# Patient Record
Sex: Female | Born: 1995 | Hispanic: Yes | Marital: Married | Smoking: Never smoker
Health system: Southern US, Community
[De-identification: ages and names within clinical notes are randomized; demographics above are authoritative.]

## PROBLEM LIST (undated history)

## (undated) DIAGNOSIS — R011 Cardiac murmur, unspecified: Secondary | ICD-10-CM

## (undated) DIAGNOSIS — D649 Anemia, unspecified: Secondary | ICD-10-CM

## (undated) HISTORY — DX: Anemia, unspecified: D64.9

## (undated) HISTORY — DX: Cardiac murmur, unspecified: R01.1

---

## 2013-07-20 ENCOUNTER — Ambulatory Visit: Payer: Self-pay | Admitting: Family Medicine

## 2013-07-20 VITALS — BP 106/80 | HR 62 | Temp 99.4°F | Resp 18 | Ht 60.0 in | Wt 116.6 lb

## 2013-07-20 DIAGNOSIS — Z0289 Encounter for other administrative examinations: Secondary | ICD-10-CM

## 2013-07-20 NOTE — Progress Notes (Signed)
[address]   [place], [place]  [address]   [phone]  Subjective:    Patient ID: Valerie Robles, female    DOB: Oct 18, 1996, 17 y.o.   MRN: [id number]  HPI This 17 y.o. female presents for evaluation for college CPE.  Attending [employer] in [place], [place]. Last physical 12/2012. Immunizations---has picture of them; had to turn in to [employer].  Flu vaccine this year no.   Eye exam in [place]; +contacts; last visit 2013. Dental exam unsure.  Regular menses; menarche age 2; monthly; bleeding 4-5 days.   Review of Systems  Constitutional: Negative.   HENT: Negative.   Eyes: Negative.   Respiratory: Negative.   Cardiovascular: Negative.   Gastrointestinal: Negative.   Endocrine: Negative.   Genitourinary: Negative.   Musculoskeletal: Negative.   Skin: Negative.   Allergic/Immunologic: Negative.   Neurological: Negative.   Hematological: Negative.   Psychiatric/Behavioral: Negative.    History reviewed. No pertinent past medical history. History reviewed. No pertinent past surgical history. No Known Allergies No current outpatient prescriptions on file prior to visit.   No current facility-administered medications on file prior to visit.   History   Social History  . Marital Status: Single    Spouse Name: N/A    Number of Children: N/A  . Years of Education: N/A   Occupational History  . Not on file.   Social History Main Topics  . Smoking status: Never Smoker   . Smokeless tobacco: Not on file  . Alcohol Use: No  . Drug Use: No  . Sexual Activity: Not on file   Other Topics Concern  . Not on file   Social History Narrative   Marital status: single; +dating. Born in [place]; moved to [place] age 6.      Children: one      Lives: on campus; usually lives with Dad in [place].  Mom in [place].      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      Tobacco: none      Alcohol: none   Drugs:  None      Exercise:  Soccer.  No other sports.   Family History  Problem Relation Age of Onset  . Asthma Sister        Objective:   Physical Exam  Nursing note and vitals reviewed. Constitutional: She is oriented to person, place, and time. She appears well-developed and well-nourished. No distress.  HENT:  Head: Normocephalic and atraumatic.  Right Ear: External ear normal.  Left Ear: External ear normal.  Nose: Nose normal.  Mouth/Throat: Oropharynx is clear and moist.  Eyes: Conjunctivae are normal. Pupils are equal, round, and reactive to light.  Neck: Normal range of motion. Neck supple. No thyromegaly present.  Cardiovascular: Normal rate, regular rhythm, normal heart sounds and intact distal pulses.  Exam reveals no gallop and no friction rub.   No murmur heard. Pulmonary/Chest: Effort normal and breath sounds normal. She has no wheezes. She has no rales.  Abdominal: Soft. Bowel sounds are normal. She exhibits no distension and no mass. There is no tenderness. There is no rebound and no guarding.  Musculoskeletal:       Right shoulder: Normal.       Left shoulder: Normal.       Cervical back: Normal.       Lumbar back: Normal.  Lymphadenopathy:    She has no cervical adenopathy.  Neurological: She is alert and oriented to person, place, and time. No cranial nerve deficit. She exhibits normal muscle tone. Coordination normal.  Skin: Skin is warm and dry. No rash noted. She is not diaphoretic.  Psychiatric: She has a normal mood and affect. Her behavior is normal. Judgment and thought content normal.       Assessment & Plan:  Health examination of defined subpopulation  1.  College physical: anticipatory guidance provided.  Recommend the following immunizations:  Meningococcal, Gardisil series, Hepatitis A series, flu vaccine.  Clearance for college; emotionally asymptomatic; no evidence of depression.  No chronic medical conditions that will interfere/prevent  participation in college courses.

## 2013-07-20 NOTE — Patient Instructions (Addendum)
1.  RECOMMEND THE FOLLOWING IMMUNIZATIONS:  MENINGOCOCCAL VACCINE   GARDISIL SERIES (FOR HUMAN PAPILLOMAVIRUS)      HEPATITIS A SERIES  FLU VACCINE EVERY FALL.

## 2015-04-19 ENCOUNTER — Ambulatory Visit (INDEPENDENT_AMBULATORY_CARE_PROVIDER_SITE_OTHER): Payer: No Typology Code available for payment source | Admitting: Family Medicine

## 2015-04-19 VITALS — BP 120/72 | HR 57 | Temp 98.6°F | Resp 16 | Ht 60.0 in | Wt 119.0 lb

## 2015-04-19 DIAGNOSIS — Z7185 Encounter for immunization safety counseling: Secondary | ICD-10-CM

## 2015-04-19 DIAGNOSIS — Z7189 Other specified counseling: Secondary | ICD-10-CM

## 2015-04-19 DIAGNOSIS — Z23 Encounter for immunization: Secondary | ICD-10-CM

## 2015-04-19 DIAGNOSIS — Z111 Encounter for screening for respiratory tuberculosis: Secondary | ICD-10-CM

## 2015-04-19 NOTE — Progress Notes (Signed)
Subjective:  This chart was scribed for [name], MD by [facility], medical scribe at [facility].The patient was seen in exam room 02 and the patient's care was started at 5:33 PM.   Patient ID: Valerie Robles, female    DOB: 1996/04/19, 19 y.o.   MRN: [id number] Chief Complaint  Patient presents with  . Immunizations    School   HPI HPI Comments: Valerie Robles is a 19 y.o. female who presents to [facility] for immunizations for school. She needs hepatitis B, tetanus and TB testing. She is unsure of last tetanus and hepatitis B immunizations.  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 She is originally from [place] and moved her when she was [age]. No recent illness. She works at [employer].   There are no active problems to display for this patient.  Past Medical History  Diagnosis Date  . Anemia   . Heart murmur    History reviewed. No pertinent past surgical history. No Known Allergies Prior to Admission medications   Not on File   History   Social History  . Marital Status: Single    Spouse Name: N/A  . Number of Children: N/A  . Years of Education: N/A   Occupational History  . Not on file.   Social History Main Topics  . Smoking status: Never Smoker   . Smokeless tobacco: Not on file  . Alcohol Use: No  . Drug Use: No  . Sexual Activity: Not on file   Other Topics Concern  . Not on file   Social History Narrative   Marital status: single; +dating. Born in [place]; moved to [place] age 42.      Children: one      Lives: on campus; usually lives with Dad in [place].  Mom in [place].      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      Tobacco: none      Alcohol: none      Drugs:  None      Exercise:  Soccer.  No other sports.   Review of Systems     Objective:  BP 120/72 mmHg  Pulse 57  Temp(Src)  98.6 F (37 C) (Oral)  Resp 16  Ht 5' (1.524 m)  Wt 119 lb (53.978 kg)  BMI 23.24 kg/m2  SpO2 98%  LMP 04/18/2015 Physical Exam  Constitutional: She is oriented to person, place, and time. She appears well-developed and well-nourished. No distress.  HENT:  Head: Normocephalic and atraumatic.  Eyes: Pupils are equal, round, and reactive to light.  Neck: Normal range of motion.  Cardiovascular: Normal rate, regular rhythm and normal heart sounds.  Exam reveals no gallop and no friction rub.   No murmur heard. Pulmonary/Chest: Effort normal and breath sounds normal. No respiratory distress.  Musculoskeletal: Normal range of motion.  Neurological: She is alert and oriented to person, place, and time.  Skin: Skin is warm and dry.  Psychiatric: She has a normal mood and affect. Her behavior is normal.  Nursing note and vitals reviewed.     Assessment & Plan:   Valerie Robles is a 19 y.o. female Immunization counseling - Plan: Hepatitis B surface antibody  - does not appear to have had all 3 hep B vaccines. Check titer, then possible booster.   -immunization registry appears to  have combine her info with other record. Advised to call health dept where most of her records were from and have them look into correcting this.   Need for Tdap vaccination - Plan: Tdap vaccine greater than or equal to 7yo IM given.   Screening-pulmonary TB - Plan: TB Skin Test placed.    No orders of the defined types were placed in this encounter.   Patient Instructions  Return in 48-72 hours for reading tb skin test.  You should receive a call or letter about your lab results within the next week to 10 days (hep B immunity to determine if you need further vaccinations).  TDAP given today.   Call the [facility] department - I can not see a database for you on the immunization registry, and it appears that your immunizations may have been merged with another person. They should be able  to help get this information corrected.   Tdap Vaccine (Tetanus, Diphtheria, Pertussis): What You Need to Know 1. Why get vaccinated? Tetanus, diphtheria and pertussis can be very serious diseases, even for adolescents and adults. Tdap vaccine can protect [place] from these diseases. TETANUS (Lockjaw) causes painful muscle tightening and stiffness, usually all over the body.  It can lead to tightening of muscles in the head and neck so you can't open your mouth, swallow, or sometimes even breathe. Tetanus kills about 1 out of 5 people who are infected. DIPHTHERIA can cause a thick coating to form in the back of the throat.  It can lead to breathing problems, paralysis, heart failure, and death. PERTUSSIS (Whooping Cough) causes severe coughing spells, which can cause difficulty breathing, vomiting and disturbed sleep.  It can also lead to weight loss, incontinence, and rib fractures. Up to 2 in 100 adolescents and 5 in 100 adults with pertussis are hospitalized or have complications, which could include pneumonia or death. These diseases are caused by bacteria. Diphtheria and pertussis are spread from person to person through coughing or sneezing. Tetanus enters the body through cuts, scratches, or wounds. Before vaccines, the [place] saw as many as 200,000 cases a year of diphtheria and pertussis, and hundreds of cases of tetanus. Since vaccination began, tetanus and diphtheria have dropped by about 99% and pertussis by about 80%. 2. Tdap vaccine Tdap vaccine can protect adolescents and adults from tetanus, diphtheria, and pertussis. One dose of Tdap is routinely given at age 41 or 72. People who did not get Tdap at that age should get it as soon as possible. Tdap is especially important for health care professionals and anyone having close contact with a baby younger than 12 months. Pregnant women should get a dose of Tdap during every pregnancy, to protect the newborn from pertussis. Infants  are most at risk for severe, life-threatening complications from pertussis. A similar vaccine, called Td, protects from tetanus and diphtheria, but not pertussis. A Td booster should be given every 10 years. Tdap may be given as one of these boosters if you have not already gotten a dose. Tdap may also be given after a severe cut or burn to prevent tetanus infection. Your doctor can give you more information. Tdap may safely be given at the same time as other vaccines. 3. Some people should not get this vaccine  If you ever had a life-threatening allergic reaction after a dose of any tetanus, diphtheria, or pertussis containing vaccine, OR if you have a severe allergy to any part of this vaccine, you should not  get Tdap. Tell your doctor if you have any severe allergies.  If you had a coma, or long or multiple seizures within 7 days after a childhood dose of DTP or DTaP, you should not get Tdap, unless a cause other than the vaccine was found. You can still get Td.  Talk to your doctor if you:  have epilepsy or another nervous system problem,  had severe pain or swelling after any vaccine containing diphtheria, tetanus or pertussis,  ever had Guillain-Barr Syndrome (GBS),  aren't feeling well on the day the shot is scheduled. 4. Risks of a vaccine reaction With any medicine, including vaccines, there is a chance of side effects. These are usually mild and go away on their own, but serious reactions are also possible. Brief fainting spells can follow a vaccination, leading to injuries from falling. Sitting or lying down for about 15 minutes can help prevent these. Tell your doctor if you feel dizzy or light-headed, or have vision changes or ringing in the ears. Mild problems following Tdap (Did not interfere with activities)  Pain where the shot was given (about 3 in 4 adolescents or 2 in 3 adults)  Redness or swelling where the shot was given (about 1 person in 5)  Mild fever of at  least 100.5F (up to about 1 in 25 adolescents or 1 in 100 adults)  Headache (about 3 or 4 people in 10)  Tiredness (about 1 person in 3 or 4)  Nausea, vomiting, diarrhea, stomach ache (up to 1 in 4 adolescents or 1 in 10 adults)  Chills, body aches, sore joints, rash, swollen glands (uncommon) Moderate problems following Tdap (Interfered with activities, but did not require medical attention)  Pain where the shot was given (about 1 in 5 adolescents or 1 in 100 adults)  Redness or swelling where the shot was given (up to about 1 in 16 adolescents or 1 in 25 adults)  Fever over 102F (about 1 in 100 adolescents or 1 in 250 adults)  Headache (about 3 in 20 adolescents or 1 in 10 adults)  Nausea, vomiting, diarrhea, stomach ache (up to 1 or 3 people in 100)  Swelling of the entire arm where the shot was given (up to about 3 in 100). Severe problems following Tdap (Unable to perform usual activities; required medical attention)  Swelling, severe pain, bleeding and redness in the arm where the shot was given (rare). A severe allergic reaction could occur after any vaccine (estimated less than 1 in a million doses). 5. What if there is a serious reaction? What should I look for?  Look for anything that concerns you, such as signs of a severe allergic reaction, very high fever, or behavior changes. Signs of a severe allergic reaction can include hives, swelling of the face and throat, difficulty breathing, a fast heartbeat, dizziness, and weakness. These would start a few minutes to a few hours after the vaccination. What should I do?  If you think it is a severe allergic reaction or other emergency that can't wait, call 9-1-1 or get the person to the nearest hospital. Otherwise, call your doctor.  Afterward, the reaction should be reported to the "Vaccine Adverse Event Reporting System" (VAERS). Your doctor might file this report, or you can do it yourself through the VAERS web site at  www.vaers.LAgents.no, or by calling 1-(505)514-6137. VAERS is only for reporting reactions. They do not give medical advice.  6. The National Vaccine Injury Compensation Program The [employer] Vaccine Injury Compensation  Program (VICP) is a Stage manager that was created to compensate people who may have been injured by certain vaccines. Persons who believe they may have been injured by a vaccine can learn about the program and about filing a claim by calling 1-845-574-4187 or visiting the VICP website at SpiritualWord.at. 7. How can I learn more?  Ask your doctor.  Call your local or state health department.  Contact the Centers for Disease Control and Prevention (CDC):  Call 458-758-8816 or visit CDC's website at PicCapture.uy. CDC Tdap Vaccine VIS (03/11/12) Document Released: 04/20/2012 Document Revised: 03/06/2014 Document Reviewed: 02/01/2014 ExitCare Patient Information 2015 [place], [place]. This information is not intended to replace advice given to you by your health care provider. Make sure you discuss any questions you have with your health care provider.      I personally performed the services described in this documentation, which was scribed in my presence. The recorded information has been reviewed and considered, and addended by me as needed.

## 2015-04-19 NOTE — Patient Instructions (Addendum)
Return in 48-72 hours for reading tb skin test.  You should receive a call or letter about your lab results within the next week to 10 days (hep B immunity to determine if you need further vaccinations).  TDAP given today.   Call the [facility] department - I can not see a database for you on the immunization registry, and it appears that your immunizations may have been merged with another person. They should be able to help get this information corrected.   Tdap Vaccine (Tetanus, Diphtheria, Pertussis): What You Need to Know 1. Why get vaccinated? Tetanus, diphtheria and pertussis can be very serious diseases, even for adolescents and adults. Tdap vaccine can protect [place] from these diseases. TETANUS (Lockjaw) causes painful muscle tightening and stiffness, usually all over the body.  It can lead to tightening of muscles in the head and neck so you can't open your mouth, swallow, or sometimes even breathe. Tetanus kills about 1 out of 5 people who are infected. DIPHTHERIA can cause a thick coating to form in the back of the throat.  It can lead to breathing problems, paralysis, heart failure, and death. PERTUSSIS (Whooping Cough) causes severe coughing spells, which can cause difficulty breathing, vomiting and disturbed sleep.  It can also lead to weight loss, incontinence, and rib fractures. Up to 2 in 100 adolescents and 5 in 100 adults with pertussis are hospitalized or have complications, which could include pneumonia or death. These diseases are caused by bacteria. Diphtheria and pertussis are spread from person to person through coughing or sneezing. Tetanus enters the body through cuts, scratches, or wounds. Before vaccines, the [place] saw as many as 200,000 cases a year of diphtheria and pertussis, and hundreds of cases of tetanus. Since vaccination began, tetanus and diphtheria have dropped by about 99% and pertussis by about 80%. 2. Tdap vaccine Tdap vaccine can  protect adolescents and adults from tetanus, diphtheria, and pertussis. One dose of Tdap is routinely given at age 8 or 91. People who did not get Tdap at that age should get it as soon as possible. Tdap is especially important for health care professionals and anyone having close contact with a baby younger than 12 months. Pregnant women should get a dose of Tdap during every pregnancy, to protect the newborn from pertussis. Infants are most at risk for severe, life-threatening complications from pertussis. A similar vaccine, called Td, protects from tetanus and diphtheria, but not pertussis. A Td booster should be given every 10 years. Tdap may be given as one of these boosters if you have not already gotten a dose. Tdap may also be given after a severe cut or burn to prevent tetanus infection. Your doctor can give you more information. Tdap may safely be given at the same time as other vaccines. 3. Some people should not get this vaccine  If you ever had a life-threatening allergic reaction after a dose of any tetanus, diphtheria, or pertussis containing vaccine, OR if you have a severe allergy to any part of this vaccine, you should not get Tdap. Tell your doctor if you have any severe allergies.  If you had a coma, or long or multiple seizures within 7 days after a childhood dose of DTP or DTaP, you should not get Tdap, unless a cause other than the vaccine was found. You can still get Td.  Talk to your doctor if you:  have epilepsy or another nervous system problem,  had severe pain or swelling after  any vaccine containing diphtheria, tetanus or pertussis,  ever had Guillain-Barr Syndrome (GBS),  aren't feeling well on the day the shot is scheduled. 4. Risks of a vaccine reaction With any medicine, including vaccines, there is a chance of side effects. These are usually mild and go away on their own, but serious reactions are also possible. Brief fainting spells can follow a  vaccination, leading to injuries from falling. Sitting or lying down for about 15 minutes can help prevent these. Tell your doctor if you feel dizzy or light-headed, or have vision changes or ringing in the ears. Mild problems following Tdap (Did not interfere with activities)  Pain where the shot was given (about 3 in 4 adolescents or 2 in 3 adults)  Redness or swelling where the shot was given (about 1 person in 5)  Mild fever of at least 100.36F (up to about 1 in 25 adolescents or 1 in 100 adults)  Headache (about 3 or 4 people in 10)  Tiredness (about 1 person in 3 or 4)  Nausea, vomiting, diarrhea, stomach ache (up to 1 in 4 adolescents or 1 in 10 adults)  Chills, body aches, sore joints, rash, swollen glands (uncommon) Moderate problems following Tdap (Interfered with activities, but did not require medical attention)  Pain where the shot was given (about 1 in 5 adolescents or 1 in 100 adults)  Redness or swelling where the shot was given (up to about 1 in 16 adolescents or 1 in 25 adults)  Fever over 102F (about 1 in 100 adolescents or 1 in 250 adults)  Headache (about 3 in 20 adolescents or 1 in 10 adults)  Nausea, vomiting, diarrhea, stomach ache (up to 1 or 3 people in 100)  Swelling of the entire arm where the shot was given (up to about 3 in 100). Severe problems following Tdap (Unable to perform usual activities; required medical attention)  Swelling, severe pain, bleeding and redness in the arm where the shot was given (rare). A severe allergic reaction could occur after any vaccine (estimated less than 1 in a million doses). 5. What if there is a serious reaction? What should I look for?  Look for anything that concerns you, such as signs of a severe allergic reaction, very high fever, or behavior changes. Signs of a severe allergic reaction can include hives, swelling of the face and throat, difficulty breathing, a fast heartbeat, dizziness, and weakness.  These would start a few minutes to a few hours after the vaccination. What should I do?  If you think it is a severe allergic reaction or other emergency that can't wait, call 9-1-1 or get the person to the nearest hospital. Otherwise, call your doctor.  Afterward, the reaction should be reported to the "Vaccine Adverse Event Reporting System" (VAERS). Your doctor might file this report, or you can do it yourself through the VAERS web site at www.vaers.LAgents.no, or by calling 1-(782)713-1166. VAERS is only for reporting reactions. They do not give medical advice.  6. The National Vaccine Injury Compensation Program The [employer] Vaccine Injury Compensation Program (VICP) is a federal program that was created to compensate people who may have been injured by certain vaccines. Persons who believe they may have been injured by a vaccine can learn about the program and about filing a claim by calling 1-270-763-4455 or visiting the VICP website at SpiritualWord.at. 7. How can I learn more?  Ask your doctor.  Call your local or state health department.  Contact the Centers for  Disease Control and Prevention (CDC):  Call (416)064-1867 or visit CDC's website at PicCapture.uy. CDC Tdap Vaccine VIS (03/11/12) Document Released: 04/20/2012 Document Revised: 03/06/2014 Document Reviewed: 02/01/2014 ExitCare Patient Information 2015 [place], [place]. This information is not intended to replace advice given to you by your health care provider. Make sure you discuss any questions you have with your health care provider.

## 2015-04-20 LAB — HEPATITIS B SURFACE ANTIBODY, QUANTITATIVE: Hepatitis B-Post: 346 m[IU]/mL

## 2015-04-22 ENCOUNTER — Ambulatory Visit (INDEPENDENT_AMBULATORY_CARE_PROVIDER_SITE_OTHER): Payer: No Typology Code available for payment source

## 2015-04-22 DIAGNOSIS — Z111 Encounter for screening for respiratory tuberculosis: Secondary | ICD-10-CM

## 2015-04-22 LAB — TB SKIN TEST: TB Skin Test: NEGATIVE

## 2018-11-04 ENCOUNTER — Ambulatory Visit: Payer: No Typology Code available for payment source | Admitting: Family Medicine

## 2019-05-23 ENCOUNTER — Emergency Department (HOSPITAL_COMMUNITY)
Admission: EM | Admit: 2019-05-23 | Discharge: 2019-05-23 | Disposition: A | Discharge status: Home / Self Care | Payer: 59 | Attending: Emergency Medicine | Admitting: Emergency Medicine

## 2019-05-23 ENCOUNTER — Encounter (HOSPITAL_COMMUNITY): Payer: Self-pay | Admitting: Emergency Medicine

## 2019-05-23 ENCOUNTER — Other Ambulatory Visit: Payer: Self-pay

## 2019-05-23 DIAGNOSIS — R519 Headache, unspecified: Secondary | ICD-10-CM

## 2019-05-23 DIAGNOSIS — Y9389 Activity, other specified: Secondary | ICD-10-CM | POA: Diagnosis not present

## 2019-05-23 DIAGNOSIS — Y929 Unspecified place or not applicable: Secondary | ICD-10-CM | POA: Diagnosis not present

## 2019-05-23 DIAGNOSIS — Y999 Unspecified external cause status: Secondary | ICD-10-CM | POA: Insufficient documentation

## 2019-05-23 DIAGNOSIS — W182XXA Fall in (into) shower or empty bathtub, initial encounter: Secondary | ICD-10-CM | POA: Insufficient documentation

## 2019-05-23 DIAGNOSIS — Y939 Activity, unspecified: Secondary | ICD-10-CM | POA: Diagnosis not present

## 2019-05-23 DIAGNOSIS — S0990XA Unspecified injury of head, initial encounter: Secondary | ICD-10-CM | POA: Diagnosis not present

## 2019-05-23 DIAGNOSIS — R51 Headache: Secondary | ICD-10-CM | POA: Diagnosis present

## 2019-05-23 LAB — POC URINE PREG, ED: Preg Test, Ur: NEGATIVE

## 2019-05-23 MED ORDER — METOCLOPRAMIDE HCL 10 MG PO TABS
10.0000 mg | ORAL_TABLET | Freq: Once | ORAL | Status: AC
  Administered 2019-05-23: 10:00:00 10 mg via ORAL
  Filled 2019-05-23: qty 1

## 2019-05-23 MED ORDER — ACETAMINOPHEN 500 MG PO TABS
1000.0000 mg | ORAL_TABLET | Freq: Once | ORAL | Status: AC
  Administered 2019-05-23: 11:00:00 1000 mg via ORAL
  Filled 2019-05-23: qty 2

## 2019-05-23 MED ORDER — METOCLOPRAMIDE HCL 10 MG PO TABS: 10.0000 mg | ORAL_TABLET | Freq: Four times a day (QID) | ORAL | 0 refills | Status: AC | PRN

## 2019-05-23 MED ORDER — DIPHENHYDRAMINE HCL 25 MG PO CAPS
25.0000 mg | ORAL_CAPSULE | Freq: Once | ORAL | Status: AC
  Administered 2019-05-23: 10:00:00 25 mg via ORAL
  Filled 2019-05-23: qty 1

## 2019-05-23 NOTE — ED Notes (Signed)
Patient verbalizes understanding of discharge instructions. Opportunity for questioning and answers were provided. Armband removed by staff, pt discharged from ED.  

## 2019-05-23 NOTE — Discharge Instructions (Addendum)
Please see the information and instructions below regarding your visit.  Your diagnoses today include:  1. Bad headache   2. Injury of head, initial encounter    Concussions are caused by acceleration/deceleration forces of the brain against the skull and in mild forms, cannot be seen on any imaging. The injury occurs at the microscopic level, and causes a disturbance more in function than the structure of the brain itself. Common symptoms of concussion include: ?Poor coordination such as stumbling or inability to walk in a straight line ?Vacant stare (befuddled facial expression) ?Delayed verbal expression (slower to answer questions or follow instructions) ?Inability to focus attention (easily distracted and unable to follow through with normal activities) ?Disorientation (walking in the wrong direction, unaware of time, date, place) ?Slurred or incoherent speech (making disjointed or incomprehensible statements) ?Emotionality out of proportion to circumstances (appearing distraught, crying for no apparent reason) ?Memory deficits (exhibited by patient repeatedly asking the same question that has already been answered or inability to recall three of three words after five minutes)  Tests performed today include:  See side panel of your discharge paperwork for testing performed today. Vital signs are listed at the bottom of these instructions.   Pregnancy test negative.   Medications prescribed:    Take only ibuprofen (Advil) or naproxen (Aleve), and acetaminophen (Tylenol).  You may take Reglan every 6 hours as needed for nausea.  Combined with Benadryl 25 mg.  Take any prescribed medications only as prescribed, and any over the counter medications only as directed on the packaging.  Home care instructions:  Please follow any educational materials contained in this packet.   (Elevate mattress if pillow is ineffective)  Do not take tranquilizers, sedatives, narcotics or alcohol  Use ice packs for comfort   Over the next couple days, avoid screen time is much as possible.  If screen time or other activity does not cause you a headache, you may continue to this activity until you do develop symptoms, then discontinue the activity.  Follow-up instructions: Please follow-up with Dr. Tamala Julian of Sports Medicine for further evaluation of your symptoms.   Return instructions:  Please return to the Emergency Department if you experience worsening symptoms.   If any of the following occur notify your physician or go to the Hospital Emergency Department:  Increased drowsiness, confusion, or loss of consciousness  Restlessness or convulsions (fits)  Paralysis in arms or legs  Temperature above 100 F  Vomiting  Severe headache  Blood or clear fluid dripping from the nose or ears  Stiffness of the neck  Dizziness or blurred vision  Pulsating pain in the eye  Unequal pupils of eye  Personality changes  Any other unusual symptoms  Please return if you have any other emergent concerns.  Additional Information:   Your vital signs today were: BP 135/85 (BP Location: Right Arm)    Pulse (!) 54    Temp 98.7 F (37.1 C) (Oral)    Resp 16    Ht 5' (1.524 m)    Wt 66.2 kg    SpO2 99%    BMI 28.51 kg/m  If your blood pressure (BP) was elevated on multiple readings during this visit above 130 for the top number or above 80 for the bottom number, please have this repeated by your primary care provider within one month. --------------  Thank you for allowing [place] to participate in your care today. It was my pleasure to care for you!

## 2019-05-23 NOTE — ED Provider Notes (Signed)
[facility] Provider Note   CSN: [id number] Arrival date & time: 05/23/19  [id number]     History   Chief Complaint Chief Complaint  Patient presents with  . Headache    HPI Valerie BailiffKarla Berenice Robles is a 23 y.o. female.     HPI  Patient is a 23 year old female past medical history of anemia and heart murmur presenting for daily headaches.  Patient reports that she was playing hide and seek with children approximately 10 days ago when she fell backwards into the bathtub and struck the back of her head.  She denies loss of consciousness, confusion, speech deficit, or vomiting at the time of the incident.  Subsequently, patient reports daily headaches that are throbbing in nature.  She is occasionally photophobic.  She reports that resting in a dark room makes them better.  No positional difference in the pain.  She denies any visual disturbance, numbness or tingling extremities, speech disturbance, or discoordinated gait.  Patient reports that 6 days ago she vomited 1 time and she occasionally has nausea.  She denies any congestion, neck stiffness, sore throat, cough.  No recent coronavirus exposures.  She has been taking intermittent Tylenol and ibuprofen for symptoms.  Patient reports that she has a history of headaches, and multiple concussions in her lifetime.  She has never been evaluated by neurology.  Past Medical History:  Diagnosis Date  . Anemia   . Heart murmur     There are no active problems to display for this patient.   History reviewed. No pertinent surgical history.   OB History   No obstetric history on file.      Home Medications    Prior to Admission medications   Not on File    Family History Family History  Problem Relation Age of Onset  . Asthma Sister     Social History Social History   Tobacco Use  . Smoking status: Never Smoker  Substance Use Topics  . Alcohol use: No  . Drug use: No     Allergies    Patient has no known allergies.   Review of Systems Review of Systems  Constitutional: Negative for chills and fever.  HENT: Positive for rhinorrhea. Negative for congestion.   Respiratory: Negative for shortness of breath.   Gastrointestinal: Positive for nausea. Negative for vomiting.  Musculoskeletal: Negative for neck pain and neck stiffness.  Skin: Negative for rash.  Neurological: Positive for headaches.     Physical Exam Updated Vital Signs BP 135/85 (BP Location: Right Arm)   Pulse (!) 54   Temp 98.7 F (37.1 C) (Oral)   Resp 16   Ht 5' (1.524 m)   Wt 66.2 kg   SpO2 99%   BMI 28.51 kg/m   Physical Exam Vitals signs and nursing note reviewed.  Constitutional:      General: She is not in acute distress.    Appearance: She is well-developed. She is not diaphoretic.     Comments: Sitting comfortably in bed.  HENT:     Head: Normocephalic and atraumatic.  Eyes:     General:        Right eye: No discharge.        Left eye: No discharge.     Conjunctiva/sclera: Conjunctivae normal.     Comments: EOMs normal to gross examination.  Neck:     Musculoskeletal: Normal range of motion. No neck rigidity.  Cardiovascular:     Rate and Rhythm: Normal rate  and regular rhythm.     Comments: Intact, 2+ radial pulse. Pulmonary:     Comments: Converses comfortably.  No audible wheeze or stridor. Abdominal:     General: There is no distension.  Musculoskeletal: Normal range of motion.  Skin:    General: Skin is warm and dry.  Neurological:     Mental Status: She is alert and oriented to person, place, and time. Mental status is at baseline.     GCS: GCS eye subscore is 4. GCS verbal subscore is 5. GCS motor subscore is 6.     Comments: Mental Status:  Alert, oriented, thought content appropriate, able to give a coherent history. Speech fluent without evidence of aphasia. Able to follow 2 step commands without difficulty.  Cranial Nerves:  II:  Peripheral visual fields  grossly normal, pupils equal, round, reactive to light III,IV, VI: ptosis not present, extra-ocular motions intact bilaterally  V,VII: smile symmetric, facial light touch sensation equal VIII: hearing grossly normal to voice  X: uvula elevates symmetrically  XI: bilateral shoulder shrug symmetric and strong XII: midline tongue extension without fassiculations Motor:  Normal tone. 5/5 in upper and lower extremities bilaterally including strong and equal grip strength and dorsiflexion/plantar flexion Sensory: Light touch normal in all extremities.  Cerebellar: normal finger-to-nose with bilateral upper extremities Gait: normal gait and balance. Performs tandem walking without difficulty Stance:  No pronator drift and good coordination, strength, and position sense with tapping of bilateral arms (performed in sitting position). CV: distal pulses palpable throughout    Psychiatric:        Behavior: Behavior normal.        Thought Content: Thought content normal.        Judgment: Judgment normal.      ED Treatments / Results  Labs (all labs ordered are listed, but only abnormal results are displayed) Labs Reviewed  POC URINE PREG, ED    EKG None  Radiology No results found.  Procedures Procedures (including critical care time)  Medications Ordered in ED Medications  metoCLOPramide (REGLAN) tablet 10 mg (10 mg Oral Given 05/23/19 1017)  diphenhydrAMINE (BENADRYL) capsule 25 mg (25 mg Oral Given 05/23/19 1017)     Initial Impression / Assessment and Plan / ED Course  I have reviewed the triage vital signs and the nursing notes.  Pertinent labs & imaging results that were available during my care of the patient were reviewed by me and considered in my medical decision making (see chart for details).        This is a well-appearing 23 year old female with past medical history of headaches presenting for daily headache for 10 days after head injury.  She had one episode of  vomiting within the last week several days after the injury but otherwise has not been vomiting, denies any focal neurologic deficits.  [facility] She reports that she has a history of concussions going back to her high school years.  No red flag signs or symptoms on exam today.  No ongoing vomiting.  Pregnancy test is negative today.  Suspect that patient does have ongoing concussion.  Will give Reglan, Benadryl for symptomatic relief with over-the-counter medications and referred to concussion clinic.  Patient is given return precautions for any worsening headache, ongoing vomiting, visual disturbance weakness of extremities, speech disturbance, or any focal neurologic symptoms.  Patient is in understanding and agrees with the plan of care.  Final Clinical Impressions(s) / ED Diagnoses   Final  diagnoses:  Bad headache  Injury of head, initial encounter    ED Discharge Orders         Ordered    metoCLOPramide (REGLAN) 10 MG tablet  Every 6 hours PRN     05/23/19 1046           Albesa Seen, PA-C 05/23/19 1049    Julianne Rice, MD 05/24/19 [phone]

## 2019-05-23 NOTE — ED Triage Notes (Addendum)
Pt arrives to ED from home with complaints of headache x2 weeks after a mechanical fall. Pt states she has no vision issues but got new contacts a month ago. Pt states she has been nauseas, had one episode of emesis, and has loss of appetite. 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

## 2019-05-24 ENCOUNTER — Telehealth: Payer: Self-pay

## 2019-05-24 NOTE — Telephone Encounter (Signed)
-----   Message from Lyndal Pulley, DO sent at 05/24/2019  7:32 AM EDT ----- Regarding: FW: ED Patient Follow Up What do you think? ----- Message ----- From: Tamala Julian Sent: 05/23/2019   6:16 PM EDT To: Lyndal Pulley, DO Subject: ED Patient Follow Up                           Hi Dr. Tamala Julian,  I evaluated the attached patient today after a head injury 10 days prior and ongoing headaches. She has a history of concussion and I think is sensitive to developing them. I was wondering if she could be seen in the concussion clinic? Thank you for caring for mutual patients.   Best,  Alyssa B. Valere Dross, PA-C

## 2019-05-24 NOTE — Telephone Encounter (Signed)
Left message for patient to call back to schedule in Concussion Clinic.  

## 2019-05-26 ENCOUNTER — Encounter (HOSPITAL_COMMUNITY): Payer: Self-pay

## 2019-05-26 ENCOUNTER — Other Ambulatory Visit: Payer: Self-pay

## 2019-05-26 ENCOUNTER — Emergency Department (HOSPITAL_COMMUNITY)
Admission: EM | Admit: 2019-05-26 | Discharge: 2019-05-26 | Disposition: A | Discharge status: Home / Self Care | Payer: 59 | Attending: Emergency Medicine | Admitting: Emergency Medicine

## 2019-05-26 ENCOUNTER — Emergency Department (HOSPITAL_COMMUNITY): Payer: 59

## 2019-05-26 DIAGNOSIS — R110 Nausea: Secondary | ICD-10-CM | POA: Diagnosis not present

## 2019-05-26 DIAGNOSIS — R42 Dizziness and giddiness: Secondary | ICD-10-CM | POA: Insufficient documentation

## 2019-05-26 DIAGNOSIS — F0781 Postconcussional syndrome: Secondary | ICD-10-CM | POA: Diagnosis not present

## 2019-05-26 DIAGNOSIS — R51 Headache: Secondary | ICD-10-CM | POA: Diagnosis not present

## 2019-05-26 LAB — CBC WITH DIFFERENTIAL/PLATELET
Abs Immature Granulocytes: 0.01 10*3/uL (ref 0.00–0.07)
Basophils Absolute: 0 10*3/uL (ref 0.0–0.1)
Basophils Relative: 0 %
Eosinophils Absolute: 0 10*3/uL (ref 0.0–0.5)
Eosinophils Relative: 0 %
HCT: 38.7 % (ref 36.0–46.0)
Hemoglobin: 12.5 g/dL (ref 12.0–15.0)
Immature Granulocytes: 0 %
Lymphocytes Relative: 32 %
Lymphs Abs: 2.3 10*3/uL (ref 0.7–4.0)
MCH: 28.2 pg (ref 26.0–34.0)
MCHC: 32.3 g/dL (ref 30.0–36.0)
MCV: 87.2 fL (ref 80.0–100.0)
Monocytes Absolute: 0.5 10*3/uL (ref 0.1–1.0)
Monocytes Relative: 6 %
Neutro Abs: 4.5 10*3/uL (ref 1.7–7.7)
Neutrophils Relative %: 62 %
Platelets: 377 10*3/uL (ref 150–400)
RBC: 4.44 MIL/uL (ref 3.87–5.11)
RDW: 12.7 % (ref 11.5–15.5)
WBC: 7.4 10*3/uL (ref 4.0–10.5)
nRBC: 0 % (ref 0.0–0.2)

## 2019-05-26 LAB — BASIC METABOLIC PANEL
Anion gap: 10 (ref 5–15)
BUN: 7 mg/dL (ref 6–20)
CO2: 22 mmol/L (ref 22–32)
Calcium: 9.7 mg/dL (ref 8.9–10.3)
Chloride: 104 mmol/L (ref 98–111)
Creatinine, Ser: 0.64 mg/dL (ref 0.44–1.00)
GFR calc Af Amer: >60 mL/min (ref >60)
GFR calc non Af Amer: >60 mL/min (ref >60)
Glucose, Bld: 90 mg/dL (ref 70–99)
Potassium: 3.9 mmol/L (ref 3.5–5.1)
Sodium: 136 mmol/L (ref 135–145)

## 2019-05-26 MED ORDER — DIPHENHYDRAMINE HCL 25 MG PO CAPS
25.0000 mg | ORAL_CAPSULE | Freq: Once | ORAL | Status: AC
  Administered 2019-05-26: 25 mg via ORAL
  Filled 2019-05-26: qty 1

## 2019-05-26 MED ORDER — METOCLOPRAMIDE HCL 10 MG PO TABS
10.0000 mg | ORAL_TABLET | Freq: Once | ORAL | Status: AC
  Administered 2019-05-26: 12:00:00 10 mg via ORAL
  Filled 2019-05-26: qty 1

## 2019-05-26 MED ORDER — KETOROLAC TROMETHAMINE 30 MG/ML IJ SOLN
30.0000 mg | Freq: Once | INTRAMUSCULAR | Status: AC
  Administered 2019-05-26: 12:00:00 30 mg via INTRAMUSCULAR
  Filled 2019-05-26: qty 1

## 2019-05-26 NOTE — ED Notes (Signed)
ED Provider at bedside. 

## 2019-05-26 NOTE — ED Triage Notes (Signed)
Pt presents to ED with dizziness and headache.  Pt fell on 05/15/19 and struck her head on 05/23/19 pt was diagnosed with a concussion.  Today pt returned to work for the first time and became very dizzy upon movement and also c/o 10/10 headache.  Pt also c/o nausea but no vomiting.  Pt reports no head scans were done on Monday.

## 2019-05-26 NOTE — ED Notes (Signed)
Pt returned from CT at this time.  

## 2019-05-26 NOTE — ED Notes (Signed)
Patient transported to CT 

## 2019-05-26 NOTE — ED Notes (Signed)
Pt provided with ice pack for head

## 2019-05-26 NOTE — ED Provider Notes (Signed)
[facility] EMERGENCY DEPARTMENT Provider Note   CSN: [id number] Arrival date & time: 05/26/19  1112    History   Chief Complaint Chief Complaint  Patient presents with  . Near Syncope    HPI Valerie Robles is a 23 y.o. female.     HPI   23 year old female presents today with complaints of headache.  Patient notes approximately 2 weeks ago she fell and hit the posterior aspect of her head.  She notes this was a mechanical fall.  Since that time she has had a posterior throbbing headache.  She notes light sensitivity, she denies any neurological deficits.  Patient notes that she gets dizzy especially when she moves, no dizziness at rest.  She denies any vomiting but notes some nausea.  She notes photophobia and fatigue.  She attempted to go back to work today but noticed that the lights were bothering her eyes too much.  She was given Reglan and ibuprofen that originally was helping, this is no longer helping her headaches.  She denies any chest pain or shortness of breath, she denies any abnormal bleeding she does note a history of anemia but has not had this checked recently.   Past Medical History:  Diagnosis Date  . Anemia   . Heart murmur     There are no active problems to display for this patient.   History reviewed. No pertinent surgical history.   OB History   No obstetric history on file.      Home Medications    Prior to Admission medications   Medication Sig Start Date End Date Taking? Authorizing Provider  metoCLOPramide (REGLAN) 10 MG tablet Take 1 tablet (10 mg total) by mouth every 6 (six) hours as needed for up to 5 days for nausea. 05/23/19 05/28/19  Albesa Seen, PA-C    Family History Family History  Problem Relation Age of Onset  . Asthma Sister     Social History Social History   Tobacco Use  . Smoking status: Never Smoker  . Smokeless tobacco: Never Used  Substance Use Topics  . Alcohol use: No  . Drug  use: No     Allergies   Patient has no known allergies.   Review of Systems Review of Systems  All other systems reviewed and are negative.    Physical Exam Updated Vital Signs BP 121/79 (BP Location: Right Arm)   Pulse (!) 49   Temp 98.2 F (36.8 C) (Oral)   Resp 14   Ht 5' (1.524 m)   Wt 66.2 kg   LMP 05/19/2019   SpO2 98%   BMI 28.51 kg/m   Physical Exam Vitals signs and nursing note reviewed.  Constitutional:      Appearance: She is well-developed.  HENT:     Head: Normocephalic and atraumatic.     Comments: Head is atraumatic no deformity of the skull, no signs of basal skull fracture neck nontender to palpation full active range of motion Eyes:     General: No scleral icterus.       Right eye: No discharge.        Left eye: No discharge.     Conjunctiva/sclera: Conjunctivae normal.     Pupils: Pupils are equal, round, and reactive to light.  Neck:     Musculoskeletal: Normal range of motion.     Vascular: No JVD.     Trachea: No tracheal deviation.  Cardiovascular:     Rate and Rhythm: Normal rate  and regular rhythm.     Heart sounds: Murmur present.     Comments: Soft systolic murmur noted Pulmonary:     Effort: Pulmonary effort is normal.     Breath sounds: No stridor.  Neurological:     Mental Status: She is alert and oriented to person, place, and time.     GCS: GCS eye subscore is 4. GCS verbal subscore is 5. GCS motor subscore is 6.     Cranial Nerves: Cranial nerves are intact.     Sensory: Sensation is intact.     Motor: Motor function is intact.     Coordination: Coordination is intact. Coordination normal.  Psychiatric:        Behavior: Behavior normal.        Thought Content: Thought content normal.        Judgment: Judgment normal.      ED Treatments / Results  Labs (all labs ordered are listed, but only abnormal results are displayed) Labs Reviewed  CBC WITH DIFFERENTIAL/PLATELET  BASIC METABOLIC PANEL    EKG None   Radiology Ct Head Wo Contrast  Result Date: 05/26/2019 CLINICAL DATA:  Head trauma, headache. Additional history provided: Fall on Monday hitting back of head, now with headaches. EXAM: CT HEAD WITHOUT CONTRAST TECHNIQUE: Contiguous axial images were obtained from the base of the skull through the vertex without intravenous contrast. COMPARISON:  No prior studies available for comparison FINDINGS: Brain: There is no acute intracranial hemorrhage or demarcated territorial infarction. No evidence of intracranial mass. No midline shift or extra-axial collection. Vascular: No hyperdense vessel. Skull: No calvarial fracture. Sinuses/Orbits: The imaged globes are unremarkable. Partial opacification of the inferior frontal sinus and anterior ethmoid air cells on the left. There is otherwise mild scattered mucosal thickening within the paranasal sinuses. No significant mastoid effusion. IMPRESSION: No evidence of acute intracranial abnormality. Paranasal sinus disease as described. Electronically Signed   By: Jackey LogeKyle  Golden   On: 05/26/2019 14:35    Procedures Procedures (including critical care time)  Medications Ordered in ED Medications  ketorolac (TORADOL) 30 MG/ML injection 30 mg (30 mg Intramuscular Given 05/26/19 1229)  metoCLOPramide (REGLAN) tablet 10 mg (10 mg Oral Given 05/26/19 1229)  diphenhydrAMINE (BENADRYL) capsule 25 mg (25 mg Oral Given 05/26/19 1228)     Initial Impression / Assessment and Plan / ED Course  I have reviewed the triage vital signs and the nursing notes.  Pertinent labs & imaging results that were available during my care of the patient were reviewed by me and considered in my medical decision making (see chart for details).       23 year old female presents today with likely concussion.  Her head CT is reassuring with no acute cranial abnormality.  She is neurologically intact.  Her labs are reassuring.  Patient stable for outpatient follow-up with neurology, strict  return precautions given.  She verbalized understanding and agreement to today's plan had no further questions or concerns at the time of discharge.   Final Clinical Impressions(s) / ED Diagnoses   Final diagnoses:  Post concussion syndrome    ED Discharge Orders    None       Eyvonne MechanicHedges, Dyanara Cozza, PA-C 05/26/19 1502    Derwood KaplanNanavati, Ankit, MD 05/29/19 1429

## 2019-05-26 NOTE — Discharge Instructions (Signed)
Please read attached information. If you experience any new or worsening signs or symptoms please return to the emergency room for evaluation. Please follow-up with your primary care provider or specialist as discussed.  °

## 2019-05-30 ENCOUNTER — Telehealth: Payer: Self-pay

## 2019-05-30 NOTE — Telephone Encounter (Signed)
-----   Message from Alphonsus Sias, [place] sent at 05/30/2019 10:47 AM EDT ----- Regarding: concussion clinic appt

## 2019-05-30 NOTE — Telephone Encounter (Signed)
Called and left message for patient to call back.

## 2019-05-31 ENCOUNTER — Telehealth: Payer: Self-pay

## 2019-05-31 NOTE — Telephone Encounter (Signed)
Called patient to schedule for virtual concussion visit.  Patient sustained concussion on 7/20 after hitting the back of her head. She states that the next day she had a headache. No loss of consciousness. ED 7/20 and 7/23. CT was negative. Had some blood work done. Treatment plan consisted of decreased screen time. Previous concussion in earlier part of 2020. Patient states she "gets concussions easily". 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 Sleeping more due to headaches. Works as a Art therapist.

## 2019-06-04 ENCOUNTER — Other Ambulatory Visit: Payer: Self-pay

## 2019-06-04 ENCOUNTER — Encounter (HOSPITAL_COMMUNITY): Payer: Self-pay | Admitting: Emergency Medicine

## 2019-06-04 ENCOUNTER — Emergency Department (HOSPITAL_COMMUNITY)
Admission: EM | Admit: 2019-06-04 | Discharge: 2019-06-04 | Disposition: A | Discharge status: Home / Self Care | Payer: 59 | Attending: Emergency Medicine | Admitting: Emergency Medicine

## 2019-06-04 ENCOUNTER — Emergency Department (HOSPITAL_COMMUNITY): Payer: 59

## 2019-06-04 DIAGNOSIS — F0781 Postconcussional syndrome: Secondary | ICD-10-CM | POA: Diagnosis not present

## 2019-06-04 DIAGNOSIS — R110 Nausea: Secondary | ICD-10-CM | POA: Diagnosis not present

## 2019-06-04 DIAGNOSIS — R519 Headache, unspecified: Secondary | ICD-10-CM

## 2019-06-04 DIAGNOSIS — R51 Headache: Secondary | ICD-10-CM | POA: Insufficient documentation

## 2019-06-04 LAB — CBC WITH DIFFERENTIAL/PLATELET
Abs Immature Granulocytes: 0.01 10*3/uL (ref 0.00–0.07)
Basophils Absolute: 0 10*3/uL (ref 0.0–0.1)
Basophils Relative: 0 %
Eosinophils Absolute: 0.2 10*3/uL (ref 0.0–0.5)
Eosinophils Relative: 2 %
HCT: 38.3 % (ref 36.0–46.0)
Hemoglobin: 12.4 g/dL (ref 12.0–15.0)
Immature Granulocytes: 0 %
Lymphocytes Relative: 37 %
Lymphs Abs: 2.9 10*3/uL (ref 0.7–4.0)
MCH: 28.5 pg (ref 26.0–34.0)
MCHC: 32.4 g/dL (ref 30.0–36.0)
MCV: 88 fL (ref 80.0–100.0)
Monocytes Absolute: 0.6 10*3/uL (ref 0.1–1.0)
Monocytes Relative: 8 %
Neutro Abs: 4.1 10*3/uL (ref 1.7–7.7)
Neutrophils Relative %: 53 %
Platelets: 350 10*3/uL (ref 150–400)
RBC: 4.35 MIL/uL (ref 3.87–5.11)
RDW: 12.7 % (ref 11.5–15.5)
WBC: 7.7 10*3/uL (ref 4.0–10.5)
nRBC: 0 % (ref 0.0–0.2)

## 2019-06-04 LAB — BASIC METABOLIC PANEL
Anion gap: 11 (ref 5–15)
BUN: 9 mg/dL (ref 6–20)
CO2: 19 mmol/L — ABNORMAL LOW (ref 22–32)
Calcium: 9.5 mg/dL (ref 8.9–10.3)
Chloride: 104 mmol/L (ref 98–111)
Creatinine, Ser: 0.55 mg/dL (ref 0.44–1.00)
GFR calc Af Amer: >60 mL/min (ref >60)
GFR calc non Af Amer: >60 mL/min (ref >60)
Glucose, Bld: 100 mg/dL — ABNORMAL HIGH (ref 70–99)
Potassium: 3.9 mmol/L (ref 3.5–5.1)
Sodium: 134 mmol/L — ABNORMAL LOW (ref 135–145)

## 2019-06-04 LAB — I-STAT BETA HCG BLOOD, ED (MC, WL, AP ONLY): I-stat hCG, quantitative: <5 m[IU]/mL (ref <5)

## 2019-06-04 MED ORDER — IOHEXOL 350 MG/ML SOLN
75.0000 mL | Freq: Once | INTRAVENOUS | Status: AC | PRN
  Administered 2019-06-04: 75 mL via INTRAVENOUS

## 2019-06-04 MED ORDER — PROCHLORPERAZINE EDISYLATE 10 MG/2ML IJ SOLN
10.0000 mg | Freq: Once | INTRAMUSCULAR | Status: AC
  Administered 2019-06-04: 10 mg via INTRAVENOUS
  Filled 2019-06-04: qty 2

## 2019-06-04 MED ORDER — KETOROLAC TROMETHAMINE 30 MG/ML IJ SOLN
30.0000 mg | Freq: Once | INTRAMUSCULAR | Status: AC
  Administered 2019-06-04: 30 mg via INTRAVENOUS
  Filled 2019-06-04: qty 1

## 2019-06-04 MED ORDER — SODIUM CHLORIDE 0.9 % IV BOLUS
1000.0000 mL | Freq: Once | INTRAVENOUS | Status: AC
  Administered 2019-06-04: 1000 mL via INTRAVENOUS

## 2019-06-04 MED ORDER — DEXAMETHASONE SODIUM PHOSPHATE 10 MG/ML IJ SOLN
10.0000 mg | Freq: Once | INTRAMUSCULAR | Status: AC
  Administered 2019-06-04: 10 mg via INTRAVENOUS
  Filled 2019-06-04: qty 1

## 2019-06-04 MED ORDER — DIPHENHYDRAMINE HCL 50 MG/ML IJ SOLN
25.0000 mg | Freq: Once | INTRAMUSCULAR | Status: AC
  Administered 2019-06-04: 25 mg via INTRAVENOUS
  Filled 2019-06-04: qty 1

## 2019-06-04 MED ORDER — MORPHINE SULFATE (PF) 4 MG/ML IV SOLN
4.0000 mg | Freq: Once | INTRAVENOUS | Status: AC
  Administered 2019-06-04: 4 mg via INTRAVENOUS
  Filled 2019-06-04: qty 1

## 2019-06-04 NOTE — ED Provider Notes (Signed)
[facility] EMERGENCY DEPARTMENT Provider Note   CSN: [id number] Arrival date & time: 06/04/19  1152  History   Chief Complaint Chief Complaint  Patient presents with   Headache    HPI Valerie Robles is a 23 y.o. female with past medical history significant for concussion who presents for evaluation of headache.  Patient notes approximately 3 weeks ago she fell and hit the posterior aspect of her head.  Been seen the emergency department x2 for her headache.  Last seen 05/26/2019 with negative head CT.  Patient states at that time she had a posterior headache however today she has had a severe frontal throbbing headache wish she describes as scarp throbbing. Onset 1 week ago. Has been taking ibuprofen for migraine.  Her current pain a 10/10.  Denies thunderclap headache.  Denies neck pain, neck stiffness or vision dizziness, lightheadedness, weakness.  Patient states she was told this was just done however this headache feels different than her previous headaches.  Denies additional aggravating or alleviating factors. Persistent nausea without vomiting.    History obtained from patient and past medical history notable issues.     HPI  Past Medical History:  Diagnosis Date   Anemia    Heart murmur     There are no active problems to display for this patient.   History reviewed. No pertinent surgical history.   OB History   No obstetric history on file.      Home Medications    Prior to Admission medications   Medication Sig Start Date End Date Taking? Authorizing Provider  metoCLOPramide (REGLAN) 10 MG tablet Take 1 tablet (10 mg total) by mouth every 6 (six) hours as needed for up to 5 days for nausea. 05/23/19 05/28/19  Albesa Seen, PA-C    Family History Family History  Problem Relation Age of Onset   Asthma Sister     Social History Social History   Tobacco Use   Smoking status: Never Smoker   Smokeless tobacco: Never Used    Substance Use Topics   Alcohol use: No   Drug use: No     Allergies   Patient has no known allergies.   Review of Systems Review of Systems  Constitutional: Negative.   HENT: Negative.   Respiratory: Negative.   Cardiovascular: Negative.   Gastrointestinal: Positive for nausea. Negative for abdominal distention, abdominal pain, anal bleeding, blood in stool, constipation, diarrhea, rectal pain and vomiting.  Genitourinary: Negative.   Musculoskeletal: Negative.   Skin: Negative.   Neurological: Positive for headaches. Negative for dizziness, tremors, seizures, syncope, facial asymmetry, speech difficulty, weakness, light-headedness and numbness.  All other systems reviewed and are negative.    Physical Exam Updated Vital Signs BP (!) 134/96    Pulse (!) 52    Temp 97.9 F (36.6 C) (Oral)    Resp 20    LMP 05/19/2019 (Exact Date)    SpO2 97%   Physical Exam  Physical Exam  Constitutional: Pt is oriented to person, place, and time. Pt appears well-developed and well-nourished. No distress.  HENT:  Head: Normocephalic and atraumatic.  Mouth/Throat: Oropharynx is clear and moist.  Eyes: Conjunctivae and EOM are normal. Pupils are equal, round, and reactive to light. No scleral icterus.  No horizontal, vertical or rotational nystagmus  Neck: Normal range of motion. Neck supple.  Full active and passive ROM without pain No midline or paraspinal tenderness No nuchal rigidity or meningeal signs  Cardiovascular: Normal rate, regular rhythm  and intact distal pulses.   Pulmonary/Chest: Effort normal and breath sounds normal. No respiratory distress. Pt has no wheezes. No rales.  Abdominal: Soft. Bowel sounds are normal. There is no tenderness. There is no rebound and no guarding.  Musculoskeletal: Normal range of motion.  Lymphadenopathy:    No cervical adenopathy.  Neurological: Pt. is alert and oriented to person, place, and time. He has normal reflexes. No cranial nerve  deficit.  Exhibits normal muscle tone. Coordination normal.  Mental Status:  Alert, oriented, thought content appropriate. Speech fluent without evidence of aphasia. Able to follow 2 step commands without difficulty.  Cranial Nerves:  II:  Peripheral visual fields grossly normal, pupils equal, round, reactive to light III,IV, VI: ptosis not present, extra-ocular motions intact bilaterally  V,VII: smile symmetric, facial light touch sensation equal VIII: hearing grossly normal bilaterally  IX,X: midline uvula rise  XI: bilateral shoulder shrug equal and strong XII: midline tongue extension  Motor:  5/5 in upper and lower extremities bilaterally including strong and equal grip strength and dorsiflexion/plantar flexion Sensory: Pinprick and light touch normal in all extremities.  Deep Tendon Reflexes: 2+ and symmetric  Cerebellar: normal finger-to-nose with bilateral upper extremities Gait: normal gait and balance CV: distal pulses palpable throughout   Skin: Skin is warm and dry. No rash noted. Pt is not diaphoretic.  Psychiatric: Pt has a normal mood and affect. Behavior is normal. Judgment and thought content normal.  Nursing note and vitals reviewed. ED Treatments / Results  Labs (all labs ordered are listed, but only abnormal results are displayed) Labs Reviewed  BASIC METABOLIC PANEL - Abnormal; Notable for the following components:      Result Value   Sodium 134 (*)    CO2 19 (*)    Glucose, Bld 100 (*)    All other components within normal limits  CBC WITH DIFFERENTIAL/PLATELET  I-STAT BETA HCG BLOOD, ED ([facility], [facility], [facility] ONLY)    EKG None  Radiology Ct Angio Head W Or Wo Contrast  Result Date: 06/04/2019 CLINICAL DATA:  Acute severe headache. EXAM: CT ANGIOGRAPHY HEAD AND NECK TECHNIQUE: Multidetector CT imaging of the head and neck was performed using the standard protocol during bolus administration of intravenous contrast. Multiplanar CT image reconstructions and MIPs were  obtained to evaluate the vascular anatomy. Carotid stenosis measurements (when applicable) are obtained utilizing NASCET criteria, using the distal internal carotid diameter as the denominator. CONTRAST:  75mL OMNIPAQUE IOHEXOL 350 MG/ML SOLN COMPARISON:  05/26/2019 FINDINGS: CT HEAD FINDINGS Brain: The brain shows a normal appearance without evidence of malformation, atrophy, old or acute small or large vessel infarction, mass lesion, hemorrhage, hydrocephalus or extra-axial collection. Vascular: No hyperdense vessel. No evidence of atherosclerotic calcification. Skull: Normal.  No traumatic finding.  No focal bone lesion. Sinuses/Orbits: Mild inflammatory change at the frontal ethmoid junction region. No advanced sinusitis. Orbits negative. Other: None significant CTA NECK FINDINGS Aortic arch: Normal Right carotid system: Normal Left carotid system: Normal Vertebral arteries: Normal Skeleton: Normal Other neck: Normal Upper chest: Normal Review of the MIP images confirms the above findings CTA HEAD FINDINGS Anterior circulation: Both internal carotid arteries widely patent through the skull base and siphon regions. The anterior and middle cerebral vessels are patent without proximal stenosis, aneurysm or vascular malformation. Both anterior cerebral arteries receive there supply from the left carotid circulation. Posterior circulation: Both vertebral arteries are patent to the basilar. No basilar stenosis. Posterior circulation branch vessels are normal. Venous sinuses: Normal Anatomic variants: None Review  of the MIP images confirms the above findings IMPRESSION: Normal head CT. Single exception to this is mild inflammatory change of the sinuses at frontal ethmoid junction, similar to the previous exam. Normal CT angiography of the head and neck. No vascular abnormality seen to explain headache. Electronically Signed   By: Paulina FusiMark  Shogry M.D.   On: 06/04/2019 16:03   Ct Angio Neck W And/or Wo Contrast  Result  Date: 06/04/2019 CLINICAL DATA:  Acute severe headache. EXAM: CT ANGIOGRAPHY HEAD AND NECK TECHNIQUE: Multidetector CT imaging of the head and neck was performed using the standard protocol during bolus administration of intravenous contrast. Multiplanar CT image reconstructions and MIPs were obtained to evaluate the vascular anatomy. Carotid stenosis measurements (when applicable) are obtained utilizing NASCET criteria, using the distal internal carotid diameter as the denominator. CONTRAST:  75mL OMNIPAQUE IOHEXOL 350 MG/ML SOLN COMPARISON:  05/26/2019 FINDINGS: CT HEAD FINDINGS Brain: The brain shows a normal appearance without evidence of malformation, atrophy, old or acute small or large vessel infarction, mass lesion, hemorrhage, hydrocephalus or extra-axial collection. Vascular: No hyperdense vessel. No evidence of atherosclerotic calcification. Skull: Normal.  No traumatic finding.  No focal bone lesion. Sinuses/Orbits: Mild inflammatory change at the frontal ethmoid junction region. No advanced sinusitis. Orbits negative. Other: None significant CTA NECK FINDINGS Aortic arch: Normal Right carotid system: Normal Left carotid system: Normal Vertebral arteries: Normal Skeleton: Normal Other neck: Normal Upper chest: Normal Review of the MIP images confirms the above findings CTA HEAD FINDINGS Anterior circulation: Both internal carotid arteries widely patent through the skull base and siphon regions. The anterior and middle cerebral vessels are patent without proximal stenosis, aneurysm or vascular malformation. Both anterior cerebral arteries receive there supply from the left carotid circulation. Posterior circulation: Both vertebral arteries are patent to the basilar. No basilar stenosis. Posterior circulation branch vessels are normal. Venous sinuses: Normal Anatomic variants: None Review of the MIP images confirms the above findings IMPRESSION: Normal head CT. Single exception to this is mild inflammatory  change of the sinuses at frontal ethmoid junction, similar to the previous exam. Normal CT angiography of the head and neck. No vascular abnormality seen to explain headache. Electronically Signed   By: Paulina FusiMark  Shogry M.D.   On: 06/04/2019 16:03    CLINICAL DATA:  Head trauma, headache. Additional history provided: Fall on Monday hitting back of head, now with headaches.  EXAM: CT HEAD WITHOUT CONTRAST  TECHNIQUE: Contiguous axial images were obtained from the base of the skull through the vertex without intravenous contrast.  COMPARISON:  No prior studies available for comparison  FINDINGS: Brain: There is no acute intracranial hemorrhage or demarcated territorial infarction. No evidence of intracranial mass. No midline shift or extra-axial collection.  Vascular: No hyperdense vessel.  Skull: No calvarial fracture.  Sinuses/Orbits: The imaged globes are unremarkable. Partial opacification of the inferior frontal sinus and anterior ethmoid air cells on the left. There is otherwise mild scattered mucosal thickening within the paranasal sinuses. No significant mastoid effusion.  IMPRESSION: No evidence of acute intracranial abnormality.  Paranasal sinus disease as described.   Electronically Signed   By: Jackey LogeKyle  Golden   On: 05/26/2019 14:35   Result History   Procedures (including critical care time)  Medications Ordered in ED Medications  dexamethasone (DECADRON) injection 10 mg (has no administration in time range)  sodium chloride 0.9 % bolus 1,000 mL (0 mLs Intravenous Stopped 06/04/19 1512)  diphenhydrAMINE (BENADRYL) injection 25 mg (25 mg Intravenous Given 06/04/19 1342)  prochlorperazine (  COMPAZINE) injection 10 mg (10 mg Intravenous Given 06/04/19 1341)  iohexol (OMNIPAQUE) 350 MG/ML injection 75 mL (75 mLs Intravenous Contrast Given 06/04/19 1537)  ketorolac (TORADOL) 30 MG/ML injection 30 mg (30 mg Intravenous Given 06/04/19 1720)  morphine 4 MG/ML  injection 4 mg (4 mg Intravenous Given 06/04/19 1720)    Initial Impression / Assessment and Plan / ED Course  I have reviewed the triage vital signs and the nursing notes.  Pertinent labs & imaging results that were available during my care of the patient were reviewed by me and considered in my medical decision making (see chart for details).  89 old presents for evaluation of headache.  Previous had posterior headache after fall and diagnosed with postconcussive syndrome.  Presents today for severe frontal headache.  Patient states these are sharp stabbing however declines and then required headache.  Onset x1 week.  States feels different than her previous headache.  Prior records reviewed--CT scan 05/26/19 No acute intracranial abnormality  Normal neurologic exam without neurologic deficits.  Patient extremely tearful on exam.  No eye pain to suggest acute angle glaucoma.  No photophobia or phonophobia.  No neck stiffness or neck rigidity. No meningismus. Patient is very vague when describing her headache.  Given vague history will obtain CTA head and neck to rule out aneurysm, dissection as her new headaches.  Migraine cocktail.  1630: CTA head and neck negative.  Likely postconcussive headache.  Given symptoms ongoing with negative imaging I [facility].  She has no neck stiffness or neck rigidity to suggest meningitis.  Do not feel patient needs LP at this time given negative CTA head and neck and length of symptoms.  1745: On reevaluation patient without headache.  Sleeping soundly.  Arousable to voice.  Will DC home.  Discussed follow-up with concussion clinic.  To new neurologic exam without deficits.  Ambulatory in ED without difficulty.  Presentation is like pts typical HA and non concerning for [facility], ICH, Meningitis, or temporal arteritis. Pt is afebrile with no focal neuro deficits, nuchal rigidity, or change in vision. Patient to follow up with concussion clinic for her  recurrent HA.  The patient has been appropriately medically screened and/or stabilized in the ED. I have low suspicion for any other emergent medical condition which would require further screening, evaluation or treatment in the ED or require inpatient management.  Patient is hemodynamically stable and in no acute distress.  Patient able to ambulate in department prior to ED.  Evaluation does not show acute pathology that would require ongoing or additional emergent interventions while in the emergency department or further inpatient treatment.  I have discussed the diagnosis with the patient and answered all questions.  Pain is been managed while in the emergency department and patient has no further complaints prior to discharge.  Patient is comfortable with plan discussed in room and is stable for discharge at this time.  I have discussed strict return precautions for returning to the emergency department.  Patient was encouraged to follow-up with PCP/specialist refer to at discharge.      Final Clinical Impressions(s) / ED Diagnoses   Final diagnoses:  Post concussion syndrome  Nonintractable episodic headache, unspecified headache type    ED Discharge Orders    None       Kemauri Musa A, PA-C 06/04/19 1755    Cathren LaineSteinl, Kevin, MD 06/05/19 [phone]

## 2019-06-04 NOTE — ED Notes (Signed)
Help patient to the bathroom patient did well  

## 2019-06-04 NOTE — Discharge Instructions (Signed)
Follow-up with concussion clinic for your headache.  Return to the ED for any new or worsening symptoms.

## 2019-06-04 NOTE — ED Notes (Signed)
Patient transported to CT 

## 2019-06-04 NOTE — ED Triage Notes (Signed)
Pt c/o continued head ache, describes as "shocks of pain," dx with concussion s/p fall 3 weeks ago.  Pt endorses nausea, denies LOC, emesis.

## 2019-06-04 NOTE — ED Notes (Signed)
Patient verbalizes understanding of discharge instructions. Opportunity for questioning and answers were provided. Armband removed by staff, pt discharged from ED.  

## 2019-06-08 ENCOUNTER — Ambulatory Visit (INDEPENDENT_AMBULATORY_CARE_PROVIDER_SITE_OTHER): Payer: 59 | Admitting: Family Medicine

## 2019-06-08 ENCOUNTER — Encounter: Payer: Self-pay | Admitting: Family Medicine

## 2019-06-08 DIAGNOSIS — G43719 Chronic migraine without aura, intractable, without status migrainosus: Secondary | ICD-10-CM | POA: Diagnosis not present

## 2019-06-08 DIAGNOSIS — G43909 Migraine, unspecified, not intractable, without status migrainosus: Secondary | ICD-10-CM | POA: Insufficient documentation

## 2019-06-08 MED ORDER — TIZANIDINE HCL 4 MG PO TABS: 4.0000 mg | ORAL_TABLET | Freq: Every evening | ORAL | 2 refills | Status: AC

## 2019-06-08 MED ORDER — ONDANSETRON 4 MG PO TBDP: 4.0000 mg | ORAL_TABLET | Freq: Three times a day (TID) | ORAL | 0 refills | Status: AC | PRN

## 2019-06-08 MED ORDER — IBUPROFEN 600 MG PO TABS: 600.0000 mg | ORAL_TABLET | Freq: Three times a day (TID) | ORAL | 0 refills | Status: AC | PRN

## 2019-06-08 NOTE — Progress Notes (Signed)
Virtual Visit via Video Note  I connected with Valerie Robles on 06/08/19 at  4:15 PM EDT by a video enabled telemedicine application and verified that I am speaking with the correct person using two identifiers.  Location: Patient: In car Provider: In office setting   I discussed the limitations of evaluation and management by telemedicine and the availability of in person appointments. The patient expressed understanding and agreed to proceed.  History of Present Illness: 23 year old female who unfortunately did have a head injury when she fell in the bathtub.  Has gone to the emergency room.  Did have a CT of the head done.  Independently visualized by me that did not show any type of bleed or bony abnormality.  Since that time has now had some chronic headaches.  Headaches seem to have a mild aura, seems to start in the back of the neck but then has bitemporal pain.  Sometimes photophobia and phonophobia noted with it.  Has had associated vomiting.  Anti-inflammatories seem to be helpful.  Does seem to be getting better though slowly.  Patient states that she usually has more chronic headaches recently but states that if she tries to increase activity it seems to get worse somewhat.    Observations/Objective: Alert and oriented x3, seems to be very pleasant.  No nystagmus noted on exam.     Assessment and Plan: I do not believe the patient has a postconcussive syndrome.  I am concerned that the patient does have some chronic migraines.  Past family history is significant for migraines in her mother and brother.  I believe the patient is starting to unfortunately have this.  Discussed the possibility of referral to neurology which patient declined at the moment and would like to try the vitamin supplementation, headache journal, discussed different triggers such as food and dehydration.  Patient will follow-up with me again in 4 to 6 weeks at that time we will see how patient  responds   Follow Up Instructions: follow up 4 weeks     I discussed the assessment and treatment plan with the patient. The patient was provided an opportunity to ask questions and all were answered. The patient agreed with the plan and demonstrated an understanding of the instructions.   The patient was advised to call back or seek an in-person evaluation if the symptoms worsen or if the condition fails to improve as anticipated.  I provided 30 minutes of face-to-face time during this encounter.   Lyndal Pulley, DO

## 2019-06-30 ENCOUNTER — Encounter: Payer: Self-pay | Admitting: Family Medicine

## 2019-06-30 ENCOUNTER — Ambulatory Visit (INDEPENDENT_AMBULATORY_CARE_PROVIDER_SITE_OTHER): Payer: 59 | Admitting: Family Medicine

## 2019-06-30 DIAGNOSIS — R51 Headache: Secondary | ICD-10-CM

## 2019-06-30 DIAGNOSIS — G4486 Cervicogenic headache: Secondary | ICD-10-CM | POA: Insufficient documentation

## 2019-06-30 NOTE — Progress Notes (Signed)
Virtual Visit via Video Note  I connected with Valerie Robles on 06/30/19 at  7:30 AM EDT by a video enabled telemedicine application and verified that I am speaking with the correct person using two identifiers.  Location: Patient: At her office setting Provider: In office   I discussed the limitations of evaluation and management by telemedicine and the availability of in person appointments. The patient expressed understanding and agreed to proceed.  History of Present Illness:HPI:Subjective  Valerie Robles is a 23 y.o. female coming in with complaint of neck pain. Patient was being seeing for a head injury originally. History of migraines. Migraines have leveled off to her baseline. Continued neck pain though at around C6-C7. Notices that she gets stiff and can get dizzy with positional changes. Feels that there is some swelling in the cervical spine more than usual this week.      Observations/Objective: Alert and oriented x3, appears fairly comfortable   Assessment and Plan: Cervicogenic headaches, would like patient to come in the office consider osteopathic manipulation showed patient's home exercises in greater detail with her limitations.   Follow Up Instructions: 2 to 3 weeks    I discussed the assessment and treatment plan with the patient. The patient was provided an opportunity to ask questions and all were answered. The patient agreed with the plan and demonstrated an understanding of the instructions.   The patient was advised to call back or seek an in-person evaluation if the symptoms worsen or if the condition fails to improve as anticipated.  I provided 16 minutes of non-face-to-face time during this encounter.   Lyndal Pulley, DO

## 2019-07-08 ENCOUNTER — Other Ambulatory Visit: Payer: Self-pay

## 2019-07-08 ENCOUNTER — Ambulatory Visit (INDEPENDENT_AMBULATORY_CARE_PROVIDER_SITE_OTHER): Payer: BC Managed Care – PPO | Admitting: Family Medicine

## 2019-07-08 ENCOUNTER — Encounter: Payer: Self-pay | Admitting: Family Medicine

## 2019-07-08 DIAGNOSIS — M999 Biomechanical lesion, unspecified: Secondary | ICD-10-CM | POA: Diagnosis not present

## 2019-07-08 DIAGNOSIS — G4486 Cervicogenic headache: Secondary | ICD-10-CM

## 2019-07-08 DIAGNOSIS — R51 Headache: Secondary | ICD-10-CM

## 2019-07-08 NOTE — Patient Instructions (Signed)
Good to see you  Ice is your friend Stay active Exercises 3 times a week.  Keep hands within peripheral vision  See me again in 6 weeks

## 2019-07-08 NOTE — Progress Notes (Signed)
[facility] [address], [place] [address] Phone: [phone] Subjective:   I Valerie Robles am serving as a Education administrator for Dr. Hulan Saas.    CC: Headache and neck pain follow-up  MPN:[id number]  Valerie Robles is a 23 y.o. female coming in with complaint of neck pain. States the neck is good. Pops more often than usual.  Patient states that the popping seems to be helping improve.  States that the headaches seem to be less and less.  Making significant progress.  Denies any visual changes at the moment.  Still taking a vitamin supplementation for the head injury.     Past Medical History:  Diagnosis Date  . Anemia   . Heart murmur    No past surgical history on file. Social History   Socioeconomic History  . Marital status: Married    Spouse name: Not on file  . Number of children: Not on file  . Years of education: Not on file  . Highest education level: Not on file  Occupational History  . Not on file  Social Needs  . Financial resource strain: Not on file  . Food insecurity    Worry: Not on file    Inability: Not on file  . Transportation needs    Medical: Not on file    Non-medical: Not on file  Tobacco Use  . Smoking status: Never Smoker  . Smokeless tobacco: Never Used  Substance and Sexual Activity  . Alcohol use: No  . Drug use: No  . Sexual activity: Not on file  Lifestyle  . Physical activity    Days per week: Not on file    Minutes per session: Not on file  . Stress: Not on file  Relationships  . Social Herbalist on phone: Not on file    Gets together: Not on file    Attends religious service: Not on file    Active member of club or organization: Not on file    Attends meetings of clubs or organizations: Not on file    Relationship status: Not on file  Other Topics Concern  . Not on file  Social History Narrative   Marital status: single; +dating. Born in [place]; moved to [place] age 24.    Children: one      Lives: on campus; usually lives with Dad in [place].  Mom in [place].      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      Tobacco: none      Alcohol: none      Drugs:  None      Exercise:  Soccer.  No other sports.   No Known Allergies Family History  Problem Relation Age of Onset  . Asthma Sister        Current Outpatient Medications (Analgesics):  .  ibuprofen (ADVIL) 600 MG tablet, Take 1 tablet (600 mg total) by mouth every 8 (eight) hours as needed.   Current Outpatient Medications (Other):  .  ondansetron (ZOFRAN ODT) 4 MG disintegrating tablet, Take 1 tablet (4 mg total) by mouth every 8 (eight) hours as needed for nausea or vomiting. .  metoCLOPramide (REGLAN) 10 MG tablet, Take 1 tablet (10 mg total) by mouth every 6 (six) hours as needed for up to 5 days for nausea.    Past medical history, social, surgical and family history all reviewed in electronic  medical record.  No pertanent information unless stated regarding to the chief complaint.   Review of Systems:  No visual changes, nausea, vomiting, diarrhea, constipation, dizziness, abdominal pain, skin rash, fevers, chills, night sweats, weight loss, swollen lymph nodes, body aches, joint swelling,chest pain, shortness of breath, mood changes.  Positive muscle aches, headaches  Objective  Blood pressure (!) 90/58, pulse 97, height 5' (1.524 m), weight 146 lb (66.2 kg), SpO2 98 %.   General: No apparent distress alert and oriented x3 mood and affect normal, dressed appropriately.  HEENT: Pupils equal, extraocular movements intact  Respiratory: Patient's speak in full sentences and does not appear short of breath  Cardiovascular: No lower extremity edema, non tender, no erythema  Skin: Warm dry intact with no signs of infection or rash on extremities or on axial skeleton.  Abdomen: Soft nontender  Neuro: Cranial nerves II through XII are intact,  neurovascularly intact in all extremities with 2+ DTRs and 2+ pulses.  Lymph: No lymphadenopathy of posterior or anterior cervical chain or axillae bilaterally.  Gait normal with good balance and coordination.  MSK:  Non tender with full range of motion and good stability and symmetric strength and tone of shoulders, elbows, wrist, hip, knee and ankles bilaterally.  Neck exam has some mild loss of lordosis but otherwise full range of motion.  Negative Spurling's.  Tightness in the parascapular region bilaterally right greater than left  Osteopathic findings C4 flexed rotated and side bent left C7 flexed rotated and side bent left T3 extended rotated and side bent right inhaled third rib T9 extended rotated and side bent left L2 flexed rotated and side bent right Sacrum right on right     Impression and Recommendations:     This case required medical decision making of moderate complexity. The above documentation has been reviewed and is accurate and complete Judi SaaZachary M Mystery Schrupp, DO       Note: This dictation was prepared with Dragon dictation along with smaller phrase technology. Any transcriptional errors that result from this process are unintentional.

## 2019-07-08 NOTE — Assessment & Plan Note (Signed)
Decision today to treat with OMT was based on Physical Exam  After verbal consent patient was treated with HVLA, ME, FPR techniques in cervical, thoracic, rib, lumbar and sacral areas  Patient tolerated the procedure well with improvement in symptoms  Patient given exercises, stretches and lifestyle modifications  See medications in patient instructions if given  Patient will follow up in 4-6 weeks 

## 2019-07-08 NOTE — Assessment & Plan Note (Signed)
Responded well to manipulation.  We discussed posture and ergonomics and given exercises that I think will be more beneficial.  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  Follow-up with me again in 4 to 6 weeks

## 2019-08-18 ENCOUNTER — Ambulatory Visit: Payer: 59 | Admitting: Family Medicine

## 2020-10-29 ENCOUNTER — Other Ambulatory Visit: Payer: 59

## 2021-05-28 IMAGING — CT CT ANGIOGRAPHY HEAD
1 of 8 series · 4 of 16 positions shown · IV contrast (OMNI)
Comparison: 05/26/2019

CLINICAL DATA: Acute severe headache.

EXAM:
CT ANGIOGRAPHY HEAD AND NECK
TECHNIQUE: Multidetector CT imaging of the head and neck was performed using
the standard protocol during bolus administration of intravenous
contrast. Multiplanar CT image reconstructions and MIPs were
obtained to evaluate the vascular anatomy. Carotid stenosis
measurements (when applicable) are obtained utilizing NASCET
criteria, using the distal internal carotid diameter as the
denominator.
CONTRAST:  75mL OMNIPAQUE IOHEXOL 350 MG/ML SOLN

[Series 14: thin axials for portal · axial · portal-venous · 0.48mm/px · z∈[-199,-4]mm · 4 of 651 slices shown]
[im 131/651  soft-tissue]
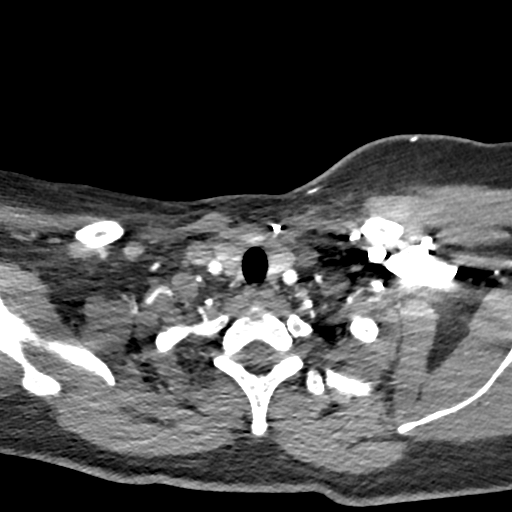
[im 261/651  bone]
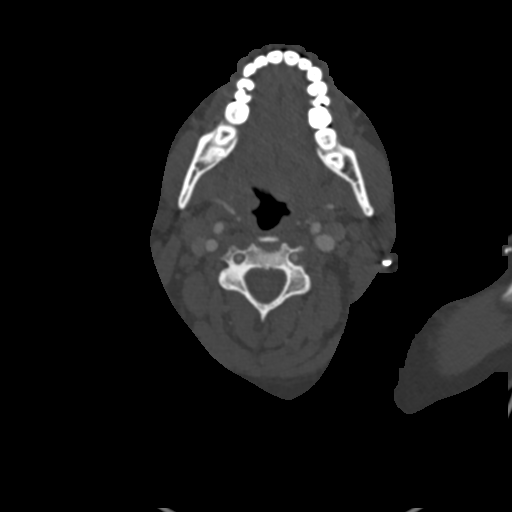
[im 391/651  soft-tissue]
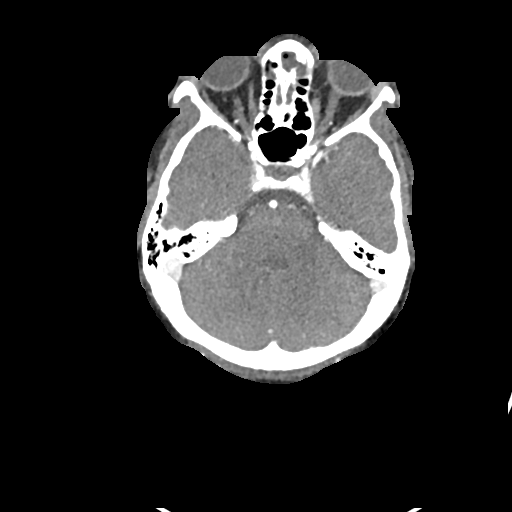
[im 521/651  bone]
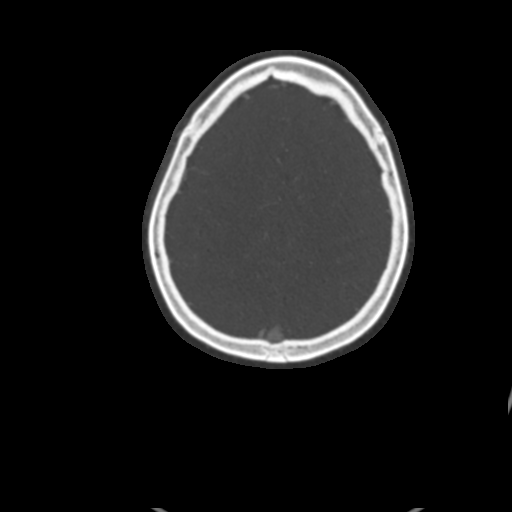

[4 of 16 positions shown; findings below may reference images not displayed]

FINDINGS: CT HEAD FINDINGS

Brain: The brain shows a normal appearance without evidence of
malformation, atrophy, old or acute small or large vessel
infarction, mass lesion, hemorrhage, hydrocephalus or extra-axial
collection.

Vascular: No hyperdense vessel. No evidence of atherosclerotic
calcification.

Skull: Normal.  No traumatic finding.  No focal bone lesion.

Sinuses/Orbits: Mild inflammatory change at the frontal ethmoid
junction region. No advanced sinusitis. Orbits negative.

Other: None significant

CTA NECK FINDINGS

Aortic arch: Normal

Right carotid system: Normal

Left carotid system: Normal

Vertebral arteries: Normal

Skeleton: Normal

Other neck: Normal

Upper chest: Normal

Review of the MIP images confirms the above findings

CTA HEAD FINDINGS

Anterior circulation: Both internal carotid arteries widely patent
through the skull base and siphon regions. The anterior and middle
cerebral vessels are patent without proximal stenosis, aneurysm or
vascular malformation. Both anterior cerebral arteries receive there
supply from the left carotid circulation.

Posterior circulation: Both vertebral arteries are patent to the
basilar. No basilar stenosis. Posterior circulation branch vessels
are normal.

Venous sinuses: Normal

Anatomic variants: None

Review of the MIP images confirms the above findings
IMPRESSION: Normal head CT. Single exception to this is mild inflammatory change
of the sinuses at frontal ethmoid junction, similar to the previous
exam.

Normal CT angiography of the head and neck. No vascular abnormality
seen to explain headache.

## 2024-03-14 ENCOUNTER — Other Ambulatory Visit: Payer: Self-pay

## 2024-03-14 ENCOUNTER — Emergency Department (HOSPITAL_BASED_OUTPATIENT_CLINIC_OR_DEPARTMENT_OTHER)
Admission: EM | Admit: 2024-03-14 | Discharge: 2024-03-14 | Disposition: A | Discharge status: Home / Self Care | Payer: Self-pay | Attending: Emergency Medicine | Admitting: Emergency Medicine

## 2024-03-14 ENCOUNTER — Encounter (HOSPITAL_BASED_OUTPATIENT_CLINIC_OR_DEPARTMENT_OTHER): Payer: Self-pay | Admitting: Emergency Medicine

## 2024-03-14 DIAGNOSIS — E860 Dehydration: Secondary | ICD-10-CM | POA: Insufficient documentation

## 2024-03-14 DIAGNOSIS — R110 Nausea: Secondary | ICD-10-CM | POA: Insufficient documentation

## 2024-03-14 DIAGNOSIS — R5383 Other fatigue: Secondary | ICD-10-CM

## 2024-03-14 LAB — URINALYSIS, ROUTINE W REFLEX MICROSCOPIC
Bilirubin Urine: NEGATIVE
Glucose, UA: NEGATIVE mg/dL
Hgb urine dipstick: NEGATIVE
Leukocytes,Ua: NEGATIVE
Nitrite: NEGATIVE
Protein, ur: 30 mg/dL — AB
Specific Gravity, Urine: 1.033 — ABNORMAL HIGH (ref 1.005–1.030)
pH: 6.5 (ref 5.0–8.0)

## 2024-03-14 LAB — BASIC METABOLIC PANEL WITH GFR
Anion gap: 14 (ref 5–15)
BUN: 14 mg/dL (ref 6–20)
CO2: 23 mmol/L (ref 22–32)
Calcium: 10.2 mg/dL (ref 8.9–10.3)
Chloride: 100 mmol/L (ref 98–111)
Creatinine, Ser: 0.86 mg/dL (ref 0.44–1.00)
GFR, Estimated: >60 mL/min (ref >60)
Glucose, Bld: 87 mg/dL (ref 70–99)
Potassium: 4.2 mmol/L (ref 3.5–5.1)
Sodium: 137 mmol/L (ref 135–145)

## 2024-03-14 LAB — RESP PANEL BY RT-PCR (RSV, FLU A&B, COVID)  RVPGX2
Influenza A by PCR: NEGATIVE
Influenza B by PCR: NEGATIVE
Resp Syncytial Virus by PCR: NEGATIVE
SARS Coronavirus 2 by RT PCR: NEGATIVE

## 2024-03-14 LAB — CBC WITH DIFFERENTIAL/PLATELET
Abs Immature Granulocytes: 0.04 10*3/uL (ref 0.00–0.07)
Basophils Absolute: 0.1 10*3/uL (ref 0.0–0.1)
Basophils Relative: 1 %
Eosinophils Absolute: 0.1 10*3/uL (ref 0.0–0.5)
Eosinophils Relative: 1 %
HCT: 36.8 % (ref 36.0–46.0)
Hemoglobin: 12 g/dL (ref 12.0–15.0)
Immature Granulocytes: 0 %
Lymphocytes Relative: 33 %
Lymphs Abs: 3.6 10*3/uL (ref 0.7–4.0)
MCH: 27.3 pg (ref 26.0–34.0)
MCHC: 32.6 g/dL (ref 30.0–36.0)
MCV: 83.8 fL (ref 80.0–100.0)
Monocytes Absolute: 0.8 10*3/uL (ref 0.1–1.0)
Monocytes Relative: 8 %
Neutro Abs: 6.2 10*3/uL (ref 1.7–7.7)
Neutrophils Relative %: 57 %
Platelets: 380 10*3/uL (ref 150–400)
RBC: 4.39 MIL/uL (ref 3.87–5.11)
RDW: 13.2 % (ref 11.5–15.5)
WBC: 10.9 10*3/uL — ABNORMAL HIGH (ref 4.0–10.5)
nRBC: 0 % (ref 0.0–0.2)

## 2024-03-14 LAB — PREGNANCY, URINE: Preg Test, Ur: NEGATIVE

## 2024-03-14 NOTE — Discharge Instructions (Signed)
 As discussed, your labs and imaging are reassuring.  After information for [facility] to establish care with.  Monitor your blood pressures in the interim and record them.  Ensure you are drinking plenty of fluids throughout the day stay hydrated.  You can drink things with electrolytes like Gatorade, Powerade, or Pedialyte.  et help right away if: You have any symptoms of very bad dehydration. You vomit every time you eat or drink. Your vomiting gets worse, does not go away, or you vomit blood or green stuff. You are getting treatment, but symptoms are getting worse. You have a fever. You have a very bad headache. You have: Diarrhea that gets worse or does not go away. Blood in your poop (stool). This may cause poop to look black and tarry. No pee in 6-8 hours. Only a small amount of pee in 6-8 hours, and the pee is very dark. You have trouble breathing.

## 2024-03-14 NOTE — ED Triage Notes (Signed)
 Increased fatigue x 2 weeks  Worse in the last week.  Headache  Reports sBP 140 today concerned for high BP

## 2024-03-14 NOTE — ED Notes (Signed)
Pt drinking Gatorade at this time

## 2024-03-14 NOTE — ED Provider Notes (Signed)
 Valerie Robles EMERGENCY DEPARTMENT AT [facility] Provider Note   CSN: [id number] Arrival date & time: 03/14/24  1655     History  Chief Complaint  Patient presents with   Fatigue    Valerie Robles is a 28 y.o. female with no significant past medical history presents the ED today for fatigue.  Patient reports fatigue over the past several weeks as well as tingling sensation in her head.  States that she has had the symptoms before but never done.  Has also been having elevated blood pressures.  States that her blood pressure was in the 170s over 100s while at work the other day.  Denies any family history of high blood pressure or cardiac conditions.  Endorses nausea without vomiting, changes to urinary or bowel habits.  No abdominal pain.  No fevers.    Home Medications Prior to Admission medications   Medication Sig Start Date End Date Taking? Authorizing Provider  ibuprofen  (ADVIL ) 600 MG tablet Take 1 tablet (600 mg total) by mouth every 8 (eight) hours as needed. 06/08/19   Isidro Margo, DO  metoCLOPramide  (REGLAN ) 10 MG tablet Take 1 tablet (10 mg total) by mouth every 6 (six) hours as needed for up to 5 days for nausea. 05/23/19 05/28/19  Murray, Alyssa B, PA-C  ondansetron  (ZOFRAN  ODT) 4 MG disintegrating tablet Take 1 tablet (4 mg total) by mouth every 8 (eight) hours as needed for nausea or vomiting. 06/08/19   Isidro Margo, DO      Allergies    Patient has no known allergies.    Review of Systems   Review of Systems  Constitutional:  Positive for fatigue.  All other systems reviewed and are negative.   Physical Exam Updated Vital Signs BP (!) 140/100 (BP Location: Right Arm)   Pulse 70   Temp 98.4 F (36.9 C) (Oral)   Resp 18   LMP 02/23/2024   SpO2 100%  Physical Exam Vitals and nursing note reviewed.  Constitutional:      General: She is not in acute distress.    Appearance: Normal appearance.  HENT:     Head: Normocephalic and  atraumatic.     Mouth/Throat:     Mouth: Mucous membranes are moist.  Eyes:     Conjunctiva/sclera: Conjunctivae normal.     Pupils: Pupils are equal, round, and reactive to light.  Cardiovascular:     Rate and Rhythm: Normal rate and regular rhythm.     Pulses: Normal pulses.     Heart sounds: Normal heart sounds.  Pulmonary:     Effort: Pulmonary effort is normal.     Breath sounds: Normal breath sounds.  Abdominal:     Palpations: Abdomen is soft.     Tenderness: There is no abdominal tenderness.  Musculoskeletal:        General: Normal range of motion.     Cervical back: Normal range of motion.  Skin:    General: Skin is warm and dry.     Findings: No rash.  Neurological:     General: No focal deficit present.     Mental Status: She is alert.     Sensory: No sensory deficit.     Motor: No weakness.  Psychiatric:        Mood and Affect: Mood normal.        Behavior: Behavior normal.    ED Results / Procedures / Treatments   Labs (all labs ordered are listed, but only abnormal  results are displayed) Labs Reviewed  CBC WITH DIFFERENTIAL/PLATELET - Abnormal; Notable for the following components:      Result Value   WBC 10.9 (*)    All other components within normal limits  URINALYSIS, ROUTINE W REFLEX MICROSCOPIC - Abnormal; Notable for the following components:   APPearance CLOUDY (*)    Specific Gravity, Urine 1.033 (*)    Ketones, ur TRACE (*)    Protein, ur 30 (*)    Bacteria, UA RARE (*)    All other components within normal limits  RESP PANEL BY RT-PCR (RSV, FLU A&B, COVID)  [id number]  BASIC METABOLIC PANEL WITH GFR  PREGNANCY, URINE    EKG EKG Interpretation Date/Time:  Monday Mar 14 2024 21:12:25 EDT Ventricular Rate:  74 PR Interval:  151 QRS Duration:  105 QT Interval:  411 QTC Calculation: 456 R Axis:   -8  Text Interpretation: Sinus rhythm Low voltage, precordial leads Confirmed by Jerald Molly [phone]) on 03/14/2024 9:20:48 PM  Radiology No  results found.  Procedures Procedures    Medications Ordered in ED Medications - No data to display  ED Course/ Medical Decision Making/ A&P                                 Medical Decision Making Amount and/or Complexity of Data Reviewed Labs: ordered.   This patient presents to the ED for concern of fatigue, this involves an extensive number of treatment options, and is a complaint that carries with it a high risk of complications and morbidity.   Differential diagnosis includes: Elevated blood pressure, anxiety, dehydration, electrolyte abnormality, viral illness, UTI, etc.   Comorbidities  See HPI above   Additional History  Additional history obtained from patient   Cardiac Monitoring / EKG  The patient was maintained on a cardiac monitor.  I personally viewed and interpreted the cardiac monitored which showed: NSR with a heart rate of 74 bpm.   Lab Tests  I ordered and personally interpreted labs.  The pertinent results include:   Negative respiratory panel BMP and CBC are reassuring UA shows protein and trace ketones and increased specific gravity - possible dehydration Negative pregnancy test    Problem List / ED Course / Critical Interventions / Medication Management  Patient endorses increased feelings of fatigue over the past several weeks with intermittent "head tingling" sensations.  Denies any fevers, vomiting, diarrhea, abdominal pain.  No chest pain or shortness of breath.  States that she has been taking her blood pressures at work sometimes and today she got a reading in the 170s over 100s.  Denies any personal of family history of heart disease/hypertension. She is not established with a primary care provider. I ordered medications including: Oral hydration for dehydration  Reevaluation of the patient after these medicines showed that the patient improved.  Reports she feels some palpitations without any sort of chest pain.  Will obtain  EKG. EKG is reassuring and shows normal sinus rhythm.  Symptoms could be related to anxiety. I have reviewed the patients home medicines and have made adjustments as needed Information for [facility] provided for patient to establish care with.  Advised to track blood pressures in the interim.   Social Determinants of Health  Access to healthcare   Test / Admission - Considered  Discussed finds with patient.  All questions were answered. He is hemodynamically stable and safe discharge home. Return  precautions given.       Final Clinical Impression(s) / ED Diagnoses Final diagnoses:  Dehydration  Fatigue, unspecified type    Rx / DC Orders ED Discharge Orders     None         Sonnie Dusky, PA-C 03/14/24 2122    Arvilla Birmingham, MD 03/14/24 [phone]

## 2024-11-09 ENCOUNTER — Encounter (HOSPITAL_BASED_OUTPATIENT_CLINIC_OR_DEPARTMENT_OTHER): Payer: Self-pay | Admitting: Emergency Medicine

## 2024-11-09 ENCOUNTER — Other Ambulatory Visit: Payer: Self-pay

## 2024-11-09 ENCOUNTER — Emergency Department (HOSPITAL_BASED_OUTPATIENT_CLINIC_OR_DEPARTMENT_OTHER)
Admission: EM | Admit: 2024-11-09 | Discharge: 2024-11-09 | Disposition: A | Discharge status: Home / Self Care | Payer: MEDICAID | Attending: Emergency Medicine | Admitting: Emergency Medicine

## 2024-11-09 DIAGNOSIS — W01198A Fall on same level from slipping, tripping and stumbling with subsequent striking against other object, initial encounter: Secondary | ICD-10-CM | POA: Insufficient documentation

## 2024-11-09 DIAGNOSIS — S161XXA Strain of muscle, fascia and tendon at neck level, initial encounter: Secondary | ICD-10-CM | POA: Insufficient documentation

## 2024-11-09 DIAGNOSIS — S060X0A Concussion without loss of consciousness, initial encounter: Secondary | ICD-10-CM | POA: Insufficient documentation

## 2024-11-09 MED ORDER — METOCLOPRAMIDE HCL 10 MG PO TABS: 10.0000 mg | ORAL_TABLET | Freq: Four times a day (QID) | ORAL | 0 refills | Status: AC

## 2024-11-09 NOTE — Discharge Instructions (Addendum)
 Symptoms are consistent with a concussion.  Your neurologic exam was normal.  Treat symptomatically with cognitive rest, Tylenol  and Motrin  for headache management.  Reglan  has been prescribed for nausea.

## 2024-11-09 NOTE — ED Triage Notes (Signed)
 Pt here from home with c/o headache and nausea , after hitting her head on a pool table on Friday

## 2024-11-09 NOTE — ED Provider Notes (Signed)
 " [place] EMERGENCY DEPARTMENT AT [facility] Provider Note   CSN: [id number] Arrival date & time: 11/09/24  9085     Patient presents with: Head Injury   Valerie Robles is a 29 y.o. female.    Head Injury    29 year old female presenting to the Emergency Department with a headache after striking her head on a pool table on Saturday.  The patient did not lose consciousness.  The episode was witnessed.  She states that she had a mechanical fall and struck her head on a pool table.  She slipped due to slippery socks.  Since the accident she has had a headache located along the vertex of her scalp with associated nausea.  She denies any fevers, chills or neck rigidity.  No focal neurologic deficits.  Endorses some neck soft tissue tenderness.  On arrival, the patient was GCS 15, ABC intact.  Prior to Admission medications  Medication Sig Start Date End Date Taking? Authorizing Provider  metoCLOPramide  (REGLAN ) 10 MG tablet Take 1 tablet (10 mg total) by mouth every 6 (six) hours. 11/09/24  Yes Jerrol Agent, MD  ibuprofen  (ADVIL ) 600 MG tablet Take 1 tablet (600 mg total) by mouth every 8 (eight) hours as needed. 06/08/19   Claudene Arthea HERO, DO  ondansetron  (ZOFRAN  ODT) 4 MG disintegrating tablet Take 1 tablet (4 mg total) by mouth every 8 (eight) hours as needed for nausea or vomiting. 06/08/19   Claudene Arthea HERO, DO    Allergies: Patient has no known allergies.    Review of Systems  All other systems reviewed and are negative.   Updated Vital Signs BP (!) 132/90 (BP Location: Right Arm)   Pulse 63   Temp 98.6 F (37 C) (Oral)   Resp 16   SpO2 100%   Physical Exam Vitals and nursing note reviewed.  Constitutional:      General: She is not in acute distress. HENT:     Head: Normocephalic and atraumatic.     Right Ear: Tympanic membrane normal.     Left Ear: Tympanic membrane normal.     Ears:     Comments: No hemotympanum Eyes:     Conjunctiva/sclera:  Conjunctivae normal.     Pupils: Pupils are equal, round, and reactive to light.  Neck:     Comments: No midline tenderness of the cervical spine, range of motion intact, mild soft tissue tenderness bilaterally of the posterior neck Cardiovascular:     Rate and Rhythm: Normal rate and regular rhythm.  Pulmonary:     Effort: Pulmonary effort is normal. No respiratory distress.  Abdominal:     General: There is no distension.     Tenderness: There is no guarding.  Musculoskeletal:        General: No deformity or signs of injury.     Cervical back: Neck supple.  Skin:    Findings: No lesion or rash.  Neurological:     General: No focal deficit present.     Mental Status: She is alert. Mental status is at baseline.     Comments: MENTAL STATUS EXAM:    Orientation: Alert and oriented to person, place and time.  Memory: Cooperative, follows commands well.  Language: Speech is clear and language is normal.   CRANIAL NERVES:    CN 2 (Optic): Visual fields intact to confrontation.  CN 3,4,6 (EOM): Pupils equal and reactive to light. Full extraocular eye movement without nystagmus.  CN 5 (Trigeminal): Facial sensation is normal, no  weakness of masticatory muscles.  CN 7 (Facial): No facial weakness or asymmetry.  CN 8 (Auditory): Auditory acuity grossly normal.  CN 9,10 (Glossophar): The uvula is midline, the palate elevates symmetrically.  CN 11 (spinal access): Normal sternocleidomastoid and trapezius strength.  CN 12 (Hypoglossal): The tongue is midline. No atrophy or fasciculations.SABRA   MOTOR:  Muscle Strength: 5/5RUE, 5/5LUE, 5/5RLE, 5/5LLE.   COORDINATION:   Intact finger-to-nose, no tremor.   SENSATION:   Intact to light touch all four extremities.  GAIT: Gait normal without ataxia      (all labs ordered are listed, but only abnormal results are displayed) Labs Reviewed - No data to display  EKG: None  Radiology: No results found.   Procedures   Medications  Ordered in the ED - No data to display                                  Medical Decision Making    29 year old female presenting to the Emergency Department with a headache after striking her head on a pool table on Saturday.  The patient did not lose consciousness.  The episode was witnessed.  She states that she had a mechanical fall and struck her head on a pool table.  She slipped due to slippery socks.  Since the accident she has had a headache located along the vertex of her scalp with associated nausea.  She denies any fevers, chills or neck rigidity.  No focal neurologic deficits.  Endorses some neck soft tissue tenderness.  On arrival, the patient was GCS 15, ABC intact.  On arrival, the patient was vitally stable, afebrile, not tachycardic or tachypneic, saturating well on room air.  Physical exam with an intact neurologic exam, no midline tenderness of the neck, some paraspinal soft tissue tenderness of the posterior neck.  Patient presents after head trauma, no syncope, persistent headache and symptoms of likely concussion.  She is negative by [place] head CT imaging criteria and also negative for cervical spine imaging by the Nexus criteria.  I explained to this this and went through the decision-making tool with the patient bedside. My strong suspicion is for concussion.  Low concern for intracranial injury or traumatic injury to the head or neck requiring further diagnostic testing by CT imaging.  Patient advised cognitive rest, alternating Tylenol  and Motrin  for headache, outpatient follow-up with a primary care physician for reassessment as needed.  Return precautions provided.     Final diagnoses:  Concussion without loss of consciousness, initial encounter  Strain of neck muscle, initial encounter    ED Discharge Orders          Ordered    metoCLOPramide  (REGLAN ) 10 MG tablet  Every 6 hours        11/09/24 0941               Jerrol Agent, MD 11/09/24 1040  "
